# Patient Record
Sex: Female | Born: 1975 | Race: Black or African American | Hispanic: No | Marital: Single | Smoking: Never smoker
Health system: Southern US, Community
[De-identification: ages and names within clinical notes are randomized; demographics above are authoritative.]

## PROBLEM LIST (undated history)

## (undated) ENCOUNTER — Inpatient Hospital Stay: Payer: Self-pay

## (undated) DIAGNOSIS — F410 Panic disorder [episodic paroxysmal anxiety] without agoraphobia: Secondary | ICD-10-CM

## (undated) DIAGNOSIS — E0590 Thyrotoxicosis, unspecified without thyrotoxic crisis or storm: Secondary | ICD-10-CM

## (undated) DIAGNOSIS — I10 Essential (primary) hypertension: Secondary | ICD-10-CM

## (undated) DIAGNOSIS — F419 Anxiety disorder, unspecified: Secondary | ICD-10-CM

---

## 2013-12-20 ENCOUNTER — Emergency Department: Payer: Self-pay | Admitting: Emergency Medicine

## 2013-12-20 LAB — URINALYSIS, COMPLETE
Bacteria: NONE SEEN
Bilirubin,UR: NEGATIVE
GLUCOSE, UR: NEGATIVE mg/dL (ref 0–75)
Ketone: NEGATIVE
Leukocyte Esterase: NEGATIVE
Nitrite: NEGATIVE
Ph: 6 (ref 4.5–8.0)
Protein: NEGATIVE
RBC,UR: 4 /HPF (ref 0–5)
Specific Gravity: 1.019 (ref 1.003–1.030)
Squamous Epithelial: <1
WBC UR: 1 /HPF (ref 0–5)

## 2013-12-20 LAB — CBC WITH DIFFERENTIAL/PLATELET
BASOS ABS: 0 10*3/uL (ref 0.0–0.1)
BASOS PCT: 0.7 %
EOS PCT: 1.9 %
Eosinophil #: 0.1 10*3/uL (ref 0.0–0.7)
HCT: 38.5 % (ref 35.0–47.0)
HGB: 12.4 g/dL (ref 12.0–16.0)
LYMPHS PCT: 43.6 %
Lymphocyte #: 2.9 10*3/uL (ref 1.0–3.6)
MCH: 27.6 pg (ref 26.0–34.0)
MCHC: 32.2 g/dL (ref 32.0–36.0)
MCV: 86 fL (ref 80–100)
MONO ABS: 0.4 x10 3/mm (ref 0.2–0.9)
MONOS PCT: 6 %
NEUTROS PCT: 47.8 %
Neutrophil #: 3.2 10*3/uL (ref 1.4–6.5)
Platelet: 357 10*3/uL (ref 150–440)
RBC: 4.49 10*6/uL (ref 3.80–5.20)
RDW: 13.6 % (ref 11.5–14.5)
WBC: 6.7 10*3/uL (ref 3.6–11.0)

## 2013-12-20 LAB — BASIC METABOLIC PANEL
Anion Gap: 6 — ABNORMAL LOW (ref 7–16)
BUN: 10 mg/dL (ref 7–18)
Calcium, Total: 8.3 mg/dL — ABNORMAL LOW (ref 8.5–10.1)
Chloride: 107 mmol/L (ref 98–107)
Co2: 27 mmol/L (ref 21–32)
Creatinine: 0.67 mg/dL (ref 0.60–1.30)
EGFR (African American): >60
Glucose: 105 mg/dL — ABNORMAL HIGH (ref 65–99)
Osmolality: 279 (ref 275–301)
POTASSIUM: 3.2 mmol/L — AB (ref 3.5–5.1)
Sodium: 140 mmol/L (ref 136–145)

## 2013-12-20 LAB — TROPONIN I

## 2014-04-07 ENCOUNTER — Emergency Department
Admit: 2014-04-07 | Disposition: A | Discharge status: Home / Self Care | Payer: Self-pay | Admitting: Emergency Medicine

## 2014-04-28 ENCOUNTER — Ambulatory Visit
Admit: 2014-04-28 | Disposition: A | Discharge status: Home / Self Care | Payer: Self-pay | Attending: Family Medicine | Admitting: Family Medicine

## 2014-11-29 ENCOUNTER — Observation Stay
Admission: EM | Admit: 2014-11-29 | Discharge: 2014-11-29 | Disposition: A | Discharge status: Home / Self Care | Payer: 59 | Attending: Obstetrics and Gynecology | Admitting: Obstetrics and Gynecology

## 2014-11-29 DIAGNOSIS — R109 Unspecified abdominal pain: Secondary | ICD-10-CM

## 2014-11-29 DIAGNOSIS — Z3A35 35 weeks gestation of pregnancy: Secondary | ICD-10-CM | POA: Diagnosis not present

## 2014-11-29 DIAGNOSIS — Z041 Encounter for examination and observation following transport accident: Principal | ICD-10-CM | POA: Insufficient documentation

## 2014-11-29 DIAGNOSIS — O26899 Other specified pregnancy related conditions, unspecified trimester: Secondary | ICD-10-CM

## 2014-11-29 HISTORY — DX: Thyrotoxicosis, unspecified without thyrotoxic crisis or storm: E05.90

## 2014-11-29 HISTORY — DX: Essential (primary) hypertension: I10

## 2014-11-29 NOTE — Discharge Instructions (Signed)
Please drink plenty of water and rest. If you have any further questions or concerns please contact your provider.
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
## 2014-12-30 ENCOUNTER — Inpatient Hospital Stay
Admission: RE | Admit: 2014-12-30 | Discharge: 2014-12-30 | Disposition: A | Discharge status: Home / Self Care | Payer: 59 | Source: Ambulatory Visit

## 2014-12-30 NOTE — Lactation Note (Signed)
Lactation Consultation Note Mom needing lactation consult.  Mom hemorrhaged after deliver losing more than 700 ml blood loss and passed a lot of clots the next day.  [facility].  Mom's significant blood loss could have delayed her mature milk transitioning in.  Mom's nipples were bruised, scaly and really sore just before leaving the hospital.  Her nipples were so sore that she could not put the baby to right side and could not leave on left side very long.  Mom had been giving formula but Megan Novak had just been spitting most of it.  Mom is experienced breast feeder for 1 year with oldest and 7 months with second baby.  Megan Novak was 7 lbs 12 oz at birth and was discharged at 7 lbs 2 oz.  Mom had started putting her back to the breast, but she would either fall asleep and not continue sucking or would keep coming on and off the breast fussing.  Intake and out put has been adequate the last 24 hrs with 3 to 4  Yellow brownish stools and 6 voids.  Mom concerned that she is not getting enough while at the breast because she keeps falling asleep or refuses to latch at all and when she comes off the breast, she is still crying and they give her formula which she spits.  Assisted mom with latching Megan Novak to the breast today.  Mom has large nipples which Megan CellaJasmine has a difficulty achieving deep latch.  We worked on depth, but she keeps resorting to a shallow latch.  Mom's breasts are slightly full and engorgement was noted in tail of spence under both arm pits.  Explained that engorgement in the tail of Megan Novak is not abnormal and cold compresses and Ibuprofen could ease her discomfort.  Infrequent swallows are heard while at the breast and then only when we massaging the breast to increase milk flow. Pre and post feeding weight revealed that she only took 6 ml from right breast and 8 ml from left breast.  When removed from the breast after 25 minutes, she was extremely fussy and rooting  for more.  We put her back to the right breast and she took 6 ml more but still was unsatiated.  Mom pumped 20 ml which was given via bottle with slow flow nipple explaining to father of baby how to pace bottlefeed.  Mom plans to get DEBP through insurance.  Loaned mom Symphony pump for 48 hrs to build up milk supply and get baby back to breast.  Discussed methods of power pumping, super pumping, breast massage and foods and galactogogues to increase milk supply.  [facility]. She will [facility] LC [facility] Monday to return pump and re evaluate plan of care.    Patient Name: Megan PieriniFaith Yvette Novak EAVWU'JToday's Date: 12/30/2014 Reason for consult: Difficult latch;Breast/nipple pain   Maternal Data Formula Feeding for Exclusion: No Has patient been taught Hand Expression?: Yes Does the patient have breastfeeding experience prior to this delivery?: Yes  Feeding Feeding Type: Breast Fed Length of feed: 30 min  LATCH Score/Interventions Latch: Grasps breast easily, tongue down, lips flanged, rhythmical sucking.  Audible Swallowing: A few with stimulation Intervention(s): Hand expression;Alternate breast massage  Type of Nipple: Everted at rest and after stimulation (Large nipples)  Comfort (Breast/Nipple): Filling, red/small blisters or bruises, mild/mod discomfort  Problem noted: Filling;Mild/Moderate discomfort Interventions (Filling): Massage;Firm support;Frequent nursing;Double electric pump Interventions (Mild/moderate discomfort): Hand massage;Hand  expression;Post-pump  Hold (Positioning): Assistance needed to correctly position infant at breast and maintain latch. Intervention(s): Breastfeeding basics reviewed;Support Pillows;Position options;Skin to skin  LATCH Score: 7  Lactation Tools Discussed/Used Tools: Pump;Bottle Breast pump type: Double-Electric Breast Pump WIC Program: No Pump Review: Setup, frequency, and cleaning;Milk Storage Initiated  by:: S.Demara Lover,RN,BSN,IBCLC Date initiated:: 12/30/14   Consult Status Consult Status: Follow-up Date: 01/01/15 Follow-up type: Other (comment) (To return loaner Symphony pump or rent for another months)    Megan Novak 12/30/2014, 1:01 PM

## 2015-10-04 ENCOUNTER — Encounter (HOSPITAL_COMMUNITY): Payer: Self-pay

## 2015-10-17 ENCOUNTER — Encounter: Payer: Self-pay | Admitting: Emergency Medicine

## 2015-10-17 ENCOUNTER — Emergency Department
Admission: EM | Admit: 2015-10-17 | Discharge: 2015-10-17 | Disposition: A | Discharge status: Home / Self Care | Payer: Self-pay | Attending: Emergency Medicine | Admitting: Emergency Medicine

## 2015-10-17 DIAGNOSIS — H1011 Acute atopic conjunctivitis, right eye: Secondary | ICD-10-CM | POA: Insufficient documentation

## 2015-10-17 DIAGNOSIS — I10 Essential (primary) hypertension: Secondary | ICD-10-CM | POA: Insufficient documentation

## 2015-10-17 MED ORDER — EYE WASH OPHTH SOLN
1.0000 [drp] | OPHTHALMIC | Status: DC | PRN
  Filled 2015-10-17: qty 118

## 2015-10-17 MED ORDER — FLUORESCEIN SODIUM 1 MG OP STRP
1.0000 | ORAL_STRIP | Freq: Once | OPHTHALMIC | Status: DC
  Filled 2015-10-17: qty 1

## 2015-10-17 MED ORDER — OLOPATADINE HCL 0.2 % OP SOLN: 1.0000 [drp] | Freq: Once | OPHTHALMIC | 0 refills | Status: AC

## 2015-10-17 NOTE — ED Provider Notes (Signed)
[facility] Emergency Department Provider Note  ____________________________________________   First MD Initiated Contact with Patient 10/17/15 0809     (approximate)  I have reviewed the triage vital signs and the nursing notes.   HISTORY  Chief Complaint Eye Pain   HPI Megan Novak is a [age] y.o. female is here with complaint of right eye discomfort. Patient states that she was outside when she noticed that her eye began to feel different. She denies any foreign body to her eye. She denies any scratching sensation. Patient states that this came on abruptly and that area itches a great deal. Prior to her arrival in the emergency room she noticed that her upper and lower eyelid was swollen. She denies any difficulty seeing other than because her eyelid is swollen.In the past patient has taken an occasional allergy pill but has never experienced anything like today. Currently she rates her pain as a 4 out of 10. Also patient is aware that she is hypertensive and has not taken her blood pressure medication in several days.   Past Medical History:  Diagnosis Date  . Hypertension   . Hyperthyroidism     Patient Active Problem List   Diagnosis Date Noted  . Abdominal pain in pregnancy, antepartum 11/29/2014    History reviewed. No pertinent surgical history.  Prior to Admission medications   Medication Sig Start Date End Date Taking? Authorizing Provider  ferrous fumarate (HEMOCYTE - 106 MG FE) 325 (106 FE) MG TABS tablet Take 2 tablets by mouth daily.    Historical Provider, MD  Olopatadine HCl 0.2 % SOLN Apply 1 drop to eye once. To affected eye 10/17/15 10/17/15  Tommi Rumpshonda L Silvina Hackleman, PA-C  Prenatal Vit-Fe Fumarate-FA (PRENATAL MULTIVITAMIN) TABS tablet Take 1 tablet by mouth daily at 12 noon.    Historical Provider, MD    Allergies Review of patient's allergies indicates no known allergies.  No family history on file.  Social History Social  History  Substance Use Topics  . Smoking status: Never Smoker  . Smokeless tobacco: Never Used  . Alcohol use No    Review of Systems Constitutional: No fever/chills Eyes: Eyelid swelling positive. ENT: No sore throat. Cardiovascular: Denies chest pain. Respiratory: Denies shortness of breath. Gastrointestinal:   No nausea, no vomiting.  Skin: Negative for rash. Neurological: Negative for headaches, focal weakness or numbness.  10-point ROS otherwise negative.  ____________________________________________   PHYSICAL EXAM:  VITAL SIGNS: ED Triage Vitals [10/17/15 0809]  Enc Vitals Group     BP (!) 163/102     Pulse Rate 75     Resp 15     Temp 98.5 F (36.9 C)     Temp Source Oral     SpO2 98 %     Weight      Height      Head Circumference      Peak Flow      Pain Score 4     Pain Loc      Pain Edu?      Excl. in GC?     Constitutional: Alert and oriented. Well appearing and in no acute distress. Eyes: Right conjunctiva is edematous, watery, with minimal injection. There is clear drainage from the eye. Both upper and lower eyelids are edematous but no discoloration seen. Right upper eyelid inverted without foreign body noted. Fluorescein dye did not reveal corneal abrasion. PERRL. EOMI. Head: Atraumatic. Nose: No congestion/rhinnorhea. Mouth/Throat: Mucous membranes are moist.  Oropharynx non-erythematous. Neck:  No stridor.   Hematological/Lymphatic/Immunilogical: No cervical lymphadenopathy. Cardiovascular: Normal rate, regular rhythm. Grossly normal heart sounds.  Good peripheral circulation. Respiratory: Normal respiratory effort.  No retractions. Lungs CTAB. Musculoskeletal: Moves upper and lower extremities without any difficulty. Normal gait was noted. Neurologic:  Normal speech and language. No gross focal neurologic deficits are appreciated. No gait instability. Skin:  Skin is warm, dry and intact. No rash noted. Psychiatric: Mood and affect are normal.  Speech and behavior are normal.  ____________________________________________   LABS (all labs ordered are listed, but only abnormal results are displayed)  Labs Reviewed - No data to display  PROCEDURES  Procedure(s) performed: None  Procedures  Critical Care performed: No  ____________________________________________   INITIAL IMPRESSION / ASSESSMENT AND PLAN / ED COURSE  Pertinent labs & imaging results that were available during my care of the patient were reviewed by me and considered in my medical decision making (see chart for details).    Clinical Course  Patient is to begin taking over-the-counter Zyrtec or Claritin for allergies. Patient was given a prescription for Pataday ophthalmic solution for allergies. Patient is to follow-up with [facility] if any continued problems. We also discussed her blood pressure and lack control. Patient was given list of clinics that charge per sliding scale so that she can have her blood pressure managed. Patient has actually been out of blood pressure medicine for more than just several days.   ____________________________________________   FINAL CLINICAL IMPRESSION(S) / ED DIAGNOSES  Final diagnoses:  Allergic conjunctivitis of right eye  Hypertension, poor control      NEW MEDICATIONS STARTED DURING THIS VISIT:  Discharge Medication List as of 10/17/2015  8:56 AM    START taking these medications   Details  Olopatadine HCl 0.2 % SOLN Apply 1 drop to eye once. To affected eye, Starting Wed 10/17/2015, Print         Note:  This document was prepared using Dragon voice recognition software and may include unintentional dictation errors.    Tommi Rumps, PA-C 10/17/15 1249    Governor Rooks, MD 10/17/15 1257

## 2015-10-17 NOTE — Discharge Instructions (Signed)
Begin using cool compresses to the right eye frequently. Use Pataday eyedrops once a day to your right eye. Begin taking Claritin or Zyrtec over-the-counter for allergy symptoms. Follow-up with [facility] listed on your papers if no improvement in 2-3 days. Follow-up with the multiple clinics listed on your discharge papers for a primary care provider. Have your blood pressure rechecked as it was elevated in the emergency room today. Return to the emergency rooms if any severe worsening of your symptoms.

## 2015-10-17 NOTE — ED Triage Notes (Addendum)
States she noticed some eye irritation and swelling to right eye this am

## 2015-11-02 ENCOUNTER — Encounter: Payer: Self-pay | Admitting: Emergency Medicine

## 2015-11-02 ENCOUNTER — Emergency Department
Admission: EM | Admit: 2015-11-02 | Discharge: 2015-11-03 | Disposition: A | Discharge status: Home / Self Care | Payer: Medicaid Other | Attending: Emergency Medicine | Admitting: Emergency Medicine

## 2015-11-02 ENCOUNTER — Emergency Department: Payer: Medicaid Other

## 2015-11-02 DIAGNOSIS — I10 Essential (primary) hypertension: Secondary | ICD-10-CM | POA: Insufficient documentation

## 2015-11-02 DIAGNOSIS — E039 Hypothyroidism, unspecified: Secondary | ICD-10-CM | POA: Diagnosis not present

## 2015-11-02 DIAGNOSIS — Z79899 Other long term (current) drug therapy: Secondary | ICD-10-CM | POA: Insufficient documentation

## 2015-11-02 DIAGNOSIS — R519 Headache, unspecified: Secondary | ICD-10-CM

## 2015-11-02 DIAGNOSIS — R51 Headache: Secondary | ICD-10-CM | POA: Diagnosis not present

## 2015-11-02 LAB — BASIC METABOLIC PANEL
Anion gap: 7 (ref 5–15)
BUN: 11 mg/dL (ref 6–20)
CALCIUM: 8.9 mg/dL (ref 8.9–10.3)
CHLORIDE: 105 mmol/L (ref 101–111)
CO2: 25 mmol/L (ref 22–32)
Creatinine, Ser: 0.56 mg/dL (ref 0.44–1.00)
GFR calc Af Amer: >60 mL/min (ref >60)
GFR calc non Af Amer: >60 mL/min (ref >60)
GLUCOSE: 108 mg/dL — AB (ref 65–99)
Potassium: 3.3 mmol/L — ABNORMAL LOW (ref 3.5–5.1)
Sodium: 137 mmol/L (ref 135–145)

## 2015-11-02 LAB — POC URINE PREG, ED: Preg Test, Ur: NEGATIVE

## 2015-11-02 LAB — CBC WITH DIFFERENTIAL/PLATELET
Basophils Absolute: 0.1 10*3/uL (ref 0–0.1)
Basophils Relative: 1 %
Eosinophils Absolute: 0.1 10*3/uL (ref 0–0.7)
Eosinophils Relative: 1 %
HCT: 39.8 % (ref 35.0–47.0)
Hemoglobin: 13.2 g/dL (ref 12.0–16.0)
LYMPHS ABS: 2 10*3/uL (ref 1.0–3.6)
Lymphocytes Relative: 21 %
MCH: 28.4 pg (ref 26.0–34.0)
MCHC: 33.1 g/dL (ref 32.0–36.0)
MCV: 85.9 fL (ref 80.0–100.0)
MONO ABS: 0.5 10*3/uL (ref 0.2–0.9)
MONOS PCT: 5 %
NEUTROS ABS: 6.7 10*3/uL — AB (ref 1.4–6.5)
Neutrophils Relative %: 72 %
Platelets: 304 10*3/uL (ref 150–440)
RBC: 4.64 MIL/uL (ref 3.80–5.20)
RDW: 13.3 % (ref 11.5–14.5)
WBC: 9.3 10*3/uL (ref 3.6–11.0)

## 2015-11-02 LAB — TROPONIN I: Troponin I: <0.03 ng/mL (ref <0.03)

## 2015-11-02 MED ORDER — SODIUM CHLORIDE 0.9 % IV BOLUS (SEPSIS)
1000.0000 mL | Freq: Once | INTRAVENOUS | Status: AC
  Administered 2015-11-02: 1000 mL via INTRAVENOUS

## 2015-11-02 MED ORDER — AMLODIPINE BESYLATE 5 MG PO TABS
5.0000 mg | ORAL_TABLET | Freq: Once | ORAL | Status: AC
  Administered 2015-11-02: 5 mg via ORAL
  Filled 2015-11-02: qty 1

## 2015-11-02 MED ORDER — PROCHLORPERAZINE EDISYLATE 5 MG/ML IJ SOLN
10.0000 mg | Freq: Once | INTRAMUSCULAR | Status: AC
  Administered 2015-11-02: 10 mg via INTRAVENOUS
  Filled 2015-11-02: qty 2

## 2015-11-02 NOTE — Discharge Instructions (Addendum)
Please seek medical attention for any high fevers, chest pain, shortness of breath, change in behavior, persistent vomiting, bloody stool or any other new or concerning symptoms.  

## 2015-11-02 NOTE — ED Provider Notes (Signed)
[facility] Emergency Department Provider Note  ____________________________________________   First MD Initiated Contact with Patient 11/02/15 2100     (approximate)  I have reviewed the triage vital signs and the nursing notes.   HISTORY  Chief Complaint Headache and Hypertension   HPI Megan Novak is a 40 y.o. 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 She says that she thinks her blood pressure "creeping up." She says that she has been having headaches have been dull and mostly concentrated to the a.m. hours when she is waking up. However, tonight at 5 PM she said that she is sudden onset and a 10 headache the left side of her head that lasted up to an hour. She says the pain has eased off at this time. She had nausea but no vomiting. Says that she has had other intermittent sharp episodes of pain that just lasts for a second or 2. [place] Spaniel that she is a family history of high blood pressure. Light also bothers her.Does not report any neck stiffness.     Past Medical History:  Diagnosis Date  . Hypertension   . Hyperthyroidism     Patient Active Problem List   Diagnosis Date Noted  . Abdominal pain in pregnancy, antepartum 11/29/2014    History reviewed. No pertinent surgical history.  Prior to Admission medications   Medication Sig Start Date End Date Taking? Authorizing Provider  amLODipine (NORVASC) 5 MG tablet Take 5 mg by mouth daily.   Yes Historical Provider, MD  lisinopril (PRINIVIL,ZESTRIL) 40 MG tablet Take 40 mg by mouth daily.   Yes Historical Provider, MD  ferrous fumarate (HEMOCYTE - 106 MG FE) 325 (106 FE) MG TABS tablet Take 2 tablets by mouth daily.    Historical Provider, MD  Prenatal Vit-Fe Fumarate-FA (PRENATAL MULTIVITAMIN) TABS tablet Take 1 tablet by mouth daily at  12 noon.    Historical Provider, MD    Allergies Review of patient's allergies indicates no known allergies.  History reviewed. No pertinent family history.  Social History Social History  Substance Use Topics  . Smoking status: Never Smoker  . Smokeless tobacco: Never Used  . Alcohol use No    Review of Systems Constitutional: No fever/chills Eyes: No visual changes. ENT: No sore throat. Cardiovascular: Denies chest pain. Respiratory: Denies shortness of breath. Gastrointestinal: No abdominal pain.  no vomiting.  No diarrhea.  No constipation. Genitourinary: Negative for dysuria. Musculoskeletal: Negative for back pain. Skin: Negative for rash. Neurological: Negative for focal weakness or numbness.  10-point ROS otherwise negative.  ____________________________________________   PHYSICAL EXAM:  VITAL SIGNS: ED Triage Vitals [11/02/15 2027]  Enc Vitals Group     BP (!) 180/95     Pulse Rate 66     Resp 17     Temp 98.4 F (36.9 C)     Temp Source Oral     SpO2 95 %     Weight 143 lb (64.9 kg)     Height 5\' 3"  (1.6 m)     Head Circumference      Peak Flow      Pain Score 10     Pain Loc      Pain Edu?      Excl. in GC?     Constitutional: Alert and oriented. Well appearing and in no acute distress.  Eyes: Conjunctivae are normal. PERRL. EOMI. Head: Atraumatic. Nose: No congestion/rhinnorhea. Mouth/Throat: Mucous membranes are moist.   Neck: No stridor.  Ranges freely without any signs of obstruction or pain. Cardiovascular: Normal rate, regular rhythm.    Respiratory: Normal respiratory effort.  No retractions. Lungs CTAB. Gastrointestinal: Soft and nontender. No distention.  Musculoskeletal: No lower extremity tenderness nor edema.  No joint effusions. Neurologic:  Normal speech and language. No gross focal neurologic deficits are appreciated.  Skin:  Skin is warm, dry and intact. No rash noted. Psychiatric: Mood and affect are normal. Speech and  behavior are normal.  ____________________________________________   LABS (all labs ordered are listed, but only abnormal results are displayed)  Labs Reviewed  CBC WITH DIFFERENTIAL/PLATELET  BASIC METABOLIC PANEL  TROPONIN I  POC URINE PREG, ED   ____________________________________________  EKG  ED ECG REPORT I, Schaevitz,  Teena Iraniavid M, the attending physician, personally viewed and interpreted this ECG.   Date: 11/02/2015  EKG Time: 2146  Rate: 64  Rhythm: normal sinus rhythm  Axis: Normal axis  Intervals:none  ST&T Change: No ST segment elevation or depression. T-wave inversion versus biphasic T waves in V2 and V3.  ____________________________________________  RADIOLOGY  Pending CT of the brain ____________________________________________   PROCEDURES  Procedure(s) performed:   Procedures  Critical Care performed:   ____________________________________________   INITIAL IMPRESSION / ASSESSMENT AND PLAN / ED COURSE  Pertinent labs & imaging results that were available during my care of the patient were reviewed by me and considered in my medical decision making (see chart for details).  ----------------------------------------- 11:14 PM on 11/02/2015 -----------------------------------------  Discussed increasing the patient's amlodipine to 10 mg daily and staying on her same dose of lisinopril. After amlodipine and appears that her blood pressure has decreased. Pending CT of the brain. CT done within 6 hours of acute onset of a headache.  If negative and pain is relieved unlikely to be subarachnoid hemorrhage. Signed out to Dr. Derrill KayGoodman.  Clinical Course     ____________________________________________   FINAL CLINICAL IMPRESSION(S) / ED DIAGNOSES  Headache. Hypertension.    NEW MEDICATIONS STARTED DURING THIS VISIT:  New Prescriptions   No medications on file     Note:  This document was prepared using Dragon voice recognition software  and may include unintentional dictation errors.    Myrna Blazeravid Matthew Schaevitz, MD 11/02/15 [id number]

## 2015-11-02 NOTE — ED Triage Notes (Signed)
Pt presents to ED with c/o severe headache intromittently the past several days that was worse today upon waking. Pt states her blood pressure at home was 174/97.  Hx of post partum preeclampsia.

## 2015-11-03 NOTE — ED Provider Notes (Signed)
Head CT without concerning findings. Patient did state she felt better and comfortable going home after headache medications.   Phineas SemenGraydon Kalsey Lull, MD 11/03/15 [phone]

## 2017-01-13 ENCOUNTER — Ambulatory Visit
Admission: EM | Admit: 2017-01-13 | Discharge: 2017-01-13 | Disposition: A | Discharge status: Home / Self Care | Payer: Worker's Compensation | Attending: Family Medicine | Admitting: Family Medicine

## 2017-01-13 ENCOUNTER — Encounter: Payer: Self-pay | Admitting: *Deleted

## 2017-01-13 DIAGNOSIS — M7918 Myalgia, other site: Secondary | ICD-10-CM | POA: Diagnosis not present

## 2017-01-13 DIAGNOSIS — M545 Low back pain: Secondary | ICD-10-CM

## 2017-01-13 DIAGNOSIS — M25561 Pain in right knee: Secondary | ICD-10-CM

## 2017-01-13 DIAGNOSIS — M25551 Pain in right hip: Secondary | ICD-10-CM | POA: Diagnosis not present

## 2017-01-13 DIAGNOSIS — W010XXA Fall on same level from slipping, tripping and stumbling without subsequent striking against object, initial encounter: Secondary | ICD-10-CM

## 2017-01-13 MED ORDER — CYCLOBENZAPRINE HCL 10 MG PO TABS: 10.0000 mg | ORAL_TABLET | Freq: Three times a day (TID) | ORAL | 0 refills | Status: DC | PRN

## 2017-01-13 MED ORDER — MELOXICAM 15 MG PO TABS: 15.0000 mg | ORAL_TABLET | Freq: Every day | ORAL | 0 refills | Status: DC

## 2017-01-13 NOTE — Discharge Instructions (Signed)
Meds as prescribed. ° °Take care ° °Dr. Bogucki  °

## 2017-01-13 NOTE — ED Provider Notes (Signed)
[facility]   CSN: [id number] Arrival date & time: 01/13/17  1840   History   Chief Complaint Chief Complaint  Patient presents with  . Back Pain  . Hip Pain  . Knee Pain   HPI  42 year old female presents with the above complaints.  Patient states that she was at work last night and tripped over a cord.  She fell and injured her right knee, right hip, and low back.  Pain is moderate in severity, 7/10.  No reports of swelling.  She reports she is used ibuprofen and Tylenol without resolution.  Worse with range of motion.  No known relieving factors.  No other associated symptoms.  No other complaints at this time.  Past Medical History:  Diagnosis Date  . Hypertension   . Hyperthyroidism    Patient Active Problem List   Diagnosis Date Noted  . Abdominal pain in pregnancy, antepartum 11/29/2014   History reviewed. No pertinent surgical history.  OB History    Gravida Para Term Preterm AB Living   3 2 2  0 0 2   SAB TAB Ectopic Multiple Live Births   0 0 0 0       Home Medications    Prior to Admission medications   Medication Sig Start Date End Date Taking? Authorizing Provider  amLODipine (NORVASC) 5 MG tablet Take 5 mg by mouth daily.   Yes [provider]  ferrous fumarate (HEMOCYTE - 106 MG FE) 325 (106 FE) MG TABS tablet Take 2 tablets by mouth daily.   Yes [provider]  lisinopril (PRINIVIL,ZESTRIL) 40 MG tablet Take 40 mg by mouth daily.   Yes [provider]  cyclobenzaprine (FLEXERIL) 10 MG tablet Take 1 tablet (10 mg total) by mouth 3 (three) times daily as needed for muscle spasms. 01/13/17   Tommie Samsook, Jenissa Tyrell G, DO  meloxicam (MOBIC) 15 MG tablet Take 1 tablet (15 mg total) by mouth daily. 01/13/17   Tommie Samsook, Jinny Sweetland G, DO  Prenatal Vit-Fe Fumarate-FA (PRENATAL MULTIVITAMIN) TABS tablet Take 1 tablet by mouth daily at 12 noon.    [provider]   Family History Family History  Problem Relation Age of Onset  . Diabetes  Mother   . Hypertension Mother   . Thyroid disease Mother   . CAD Mother   . Cancer Father    Social History Social History   Tobacco Use  . Smoking status: Never Smoker  . Smokeless tobacco: Never Used  Substance Use Topics  . Alcohol use: No  . Drug use: No   Allergies   Patient has no known allergies.  Review of Systems Review of Systems  Constitutional: Negative.   Musculoskeletal: Positive for back pain.       Right knee pain, right hip pain.   Physical Exam Triage Vital Signs ED Triage Vitals  Enc Vitals Group     BP 01/13/17 1856 (!) 147/83     Pulse Rate 01/13/17 1856 72     Resp 01/13/17 1856 16     Temp 01/13/17 1856 98.6 F (37 C)     Temp Source 01/13/17 1856 Oral     SpO2 01/13/17 1856 100 %     Weight 01/13/17 1858 151 lb (68.5 kg)     Height 01/13/17 1858 5\' 3"  (1.6 m)     Head Circumference --      Peak Flow --      Pain Score 01/13/17 1859 7     Pain Loc --  Pain Edu? --      Excl. in GC? --    Updated Vital Signs BP (!) 147/83 (BP Location: Left Arm)   Pulse 72   Temp 98.6 F (37 C) (Oral)   Resp 16   Ht 5\' 3"  (1.6 m)   Wt 151 lb (68.5 kg)   LMP 01/04/2017   SpO2 100%   BMI 26.75 kg/m    Physical Exam  Constitutional: She is oriented to person, place, and time. She appears well-developed and well-nourished. No distress.  Cardiovascular: Normal rate and regular rhythm.  No murmur heard. Pulmonary/Chest: Effort normal and breath sounds normal. She has no wheezes. She has no rales.  Musculoskeletal:  Right knee -joint line tenderness medially and laterally. Ligaments intact.  No effusion.  Lumbar -right paraspinal muscular tenderness.  Decreased range of motion secondary to pain.  Negative straight leg raise.    Neurological: She is alert and oriented to person, place, and time.  Psychiatric: She has a normal mood and affect. Her behavior is normal.  Nursing note and vitals reviewed.  UC Treatments / Results  Labs (all labs  ordered are listed, but only abnormal results are displayed) Labs Reviewed - No data to display  EKG  EKG Interpretation None       Radiology No results found.  Procedures Procedures (including critical care time)  Medications Ordered in UC Medications - No data to display   Initial Impression / Assessment and Plan / UC Course  I have reviewed the triage vital signs and the nursing notes.  Pertinent labs & imaging results that were available during my care of the patient were reviewed by me and considered in my medical decision making (see chart for details).     42 year old female presents with musculoskeletal pain after a fall.  Treating with Flexeril and meloxicam.  Final Clinical Impressions(s) / UC Diagnoses   Final diagnoses:  Musculoskeletal pain    ED Discharge Orders        Ordered    meloxicam (MOBIC) 15 MG tablet  Daily     01/13/17 2006    cyclobenzaprine (FLEXERIL) 10 MG tablet  3 times daily PRN     01/13/17 2006     Controlled Substance Prescriptions [place] Controlled Substance Registry consulted? Not Applicable   Dhani, Imel, DO 01/13/17 2122

## 2017-01-13 NOTE — ED Triage Notes (Signed)
While at work last night, pt tripped on a "cord" and fell landing on her right knee and back. Now c/o rightsided low back pain with pain to the right hip and knee.

## 2017-01-16 ENCOUNTER — Telehealth: Payer: Self-pay | Admitting: Emergency Medicine

## 2017-01-16 NOTE — Telephone Encounter (Signed)
Left message for patient to call us back regarding any questions or concerns regarding her recent visit [facility].

## 2017-10-19 ENCOUNTER — Encounter: Payer: Self-pay | Admitting: Emergency Medicine

## 2017-10-19 ENCOUNTER — Ambulatory Visit
Admission: EM | Admit: 2017-10-19 | Discharge: 2017-10-19 | Disposition: A | Discharge status: Home / Self Care | Payer: Medicaid Other | Attending: Family Medicine | Admitting: Family Medicine

## 2017-10-19 ENCOUNTER — Other Ambulatory Visit: Payer: Self-pay

## 2017-10-19 DIAGNOSIS — J029 Acute pharyngitis, unspecified: Secondary | ICD-10-CM | POA: Diagnosis not present

## 2017-10-19 DIAGNOSIS — R509 Fever, unspecified: Secondary | ICD-10-CM

## 2017-10-19 DIAGNOSIS — R05 Cough: Secondary | ICD-10-CM

## 2017-10-19 LAB — RAPID STREP SCREEN (MED CTR MEBANE ONLY): Streptococcus, Group A Screen (Direct): NEGATIVE

## 2017-10-19 NOTE — ED Triage Notes (Signed)
Patient c/o sore throat that started this morning.

## 2017-10-19 NOTE — ED Provider Notes (Signed)
[facility]    CSN: [id number] Arrival date & time: 10/19/17  4098     History   Chief Complaint Chief Complaint  Patient presents with  . Sore Throat    HPI Breely Sandy Haye is a 42 y.o. female.   HPI  42 year old female presents along with her daughter who has the same symptoms.  She started having a sore throat this morning.  She is had a low-grade fever but no significant chills.  Had a mild cough.  Currently temperature is 99.5 pulse rate of 80 blood pressure 129/80 O2 sats 99% on room air         Past Medical History:  Diagnosis Date  . Hypertension   . Hyperthyroidism     Patient Active Problem List   Diagnosis Date Noted  . Abdominal pain in pregnancy, antepartum 11/29/2014    History reviewed. No pertinent surgical history.  OB History    Gravida  3   Para  2   Term  2   Preterm  0   AB  0   Living  2     SAB  0   TAB  0   Ectopic  0   Multiple  0   Live Births               Home Medications    Prior to Admission medications   Medication Sig Start Date End Date Taking? Authorizing Provider  amLODipine (NORVASC) 5 MG tablet Take 5 mg by mouth daily.   Yes [provider]  lisinopril (PRINIVIL,ZESTRIL) 40 MG tablet Take 40 mg by mouth daily.   Yes [provider]    Family History Family History  Problem Relation Age of Onset  . Diabetes Mother   . Hypertension Mother   . Thyroid disease Mother   . CAD Mother   . Cancer Father     Social History Social History   Tobacco Use  . Smoking status: Never Smoker  . Smokeless tobacco: Never Used  Substance Use Topics  . Alcohol use: No  . Drug use: No     Allergies   Metronidazole   Review of Systems Review of Systems  Constitutional: Positive for activity change, fatigue and fever. Negative for chills.  HENT: Positive for congestion and sore throat.   Respiratory: Positive for cough.   All other systems reviewed and are  negative.    Physical Exam Triage Vital Signs ED Triage Vitals  Enc Vitals Group     BP 10/19/17 1852 129/80     Pulse Rate 10/19/17 1852 80     Resp 10/19/17 1852 14     Temp 10/19/17 1852 99.5 F (37.5 C)     Temp Source 10/19/17 1852 Oral     SpO2 10/19/17 1852 99 %     Weight 10/19/17 1849 143 lb (64.9 kg)     Height 10/19/17 1849 5\' 2"  (1.575 m)     Head Circumference --      Peak Flow --      Pain Score 10/19/17 1849 2     Pain Loc --      Pain Edu? --      Excl. in GC? --    No data found.  Updated Vital Signs BP 129/80 (BP Location: Left Arm)   Pulse 80   Temp 99.5 F (37.5 C) (Oral)   Resp 14   Ht 5\' 2"  (1.575 m)   Wt 143 lb (64.9 kg)  LMP 10/05/2017   SpO2 99%   Breastfeeding? No   BMI 26.16 kg/m   Visual Acuity Right Eye Distance:   Left Eye Distance:   Bilateral Distance:    Right Eye Near:   Left Eye Near:    Bilateral Near:     Physical Exam  Constitutional: She is oriented to person, place, and time. She appears well-developed and well-nourished.  Non-toxic appearance. She does not appear ill. No distress.  HENT:  Head: Normocephalic.  Right Ear: Tympanic membrane and ear canal normal.  Left Ear: Tympanic membrane and ear canal normal.  Mouth/Throat: Oropharynx is clear and moist and mucous membranes are normal. No oral lesions. No uvula swelling. No oropharyngeal exudate, posterior oropharyngeal edema or posterior oropharyngeal erythema. Tonsils are 0 on the right. Tonsils are 0 on the left. No tonsillar exudate.  Eyes: Pupils are equal, round, and reactive to light.  Pulmonary/Chest: Effort normal and breath sounds normal.  Neurological: She is alert and oriented to person, place, and time.  Skin: Skin is warm and dry.  Psychiatric: She has a normal mood and affect. Her behavior is normal.  Nursing note and vitals reviewed.    UC Treatments / Results  Labs (all labs ordered are listed, but only abnormal results are displayed) Labs  Reviewed  RAPID STREP SCREEN ([facility] ONLY)  CULTURE, GROUP A STREP [facility])    EKG None  Radiology No results found.  Procedures Procedures (including critical care time)  Medications Ordered in UC Medications - No data to display  Initial Impression / Assessment and Plan / UC Course  I have reviewed the triage vital signs and the nursing notes.  Pertinent labs & imaging results that were available during my care of the patient were reviewed by me and considered in my medical decision making (see chart for details).      Discussed with Patient that her rapid strep was negative for group A.  We will culture the throat swab which will be available in 48 hours.  This is likely a viral illness does not require antibiotics at this time.  Meantime she will take the Profen or Tylenol for fever or body aches also for sore throat pain.  She can gargle with salt water ad lib. for discomfort.  If she is not improving she should follow-up with her primary care physician Final Clinical Impressions(s) / UC Diagnoses   Final diagnoses:  Sore throat   Discharge Instructions   None    ED Prescriptions    None     Controlled Substance Prescriptions [place] Controlled Substance Registry consulted? Not Applicable   Lutricia Feil, PA-C 10/19/17 1943

## 2017-10-22 LAB — CULTURE, GROUP A STREP (THRC)

## 2018-01-16 IMAGING — CT CT HEAD W/O CM
3 series · 15 of 45 positions shown, 18 images · non-contrast
Comparison: 12/20/2013

CLINICAL DATA: Intermittent severe headache for several days
worsening upon waking.

EXAM:
CT HEAD WITHOUT CONTRAST
TECHNIQUE: Contiguous axial images were obtained from the base of the skull
through the vertex without intravenous contrast.

[Series 2: head wo · axial · 0.40mm/px · z∈[-139,-24]mm · 9 of 28 slices shown, 12 images]
[im 3/28  brain]
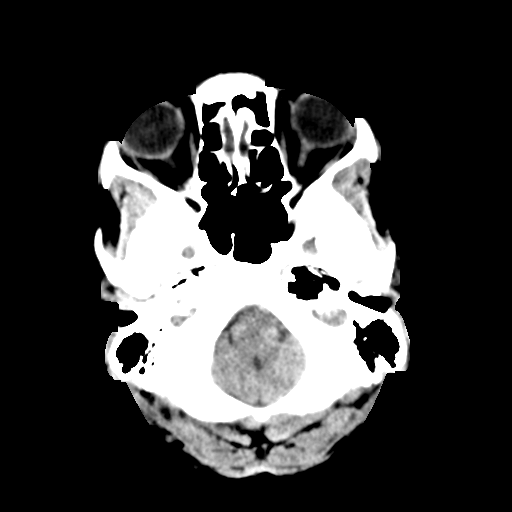
[im 3/28  bone]
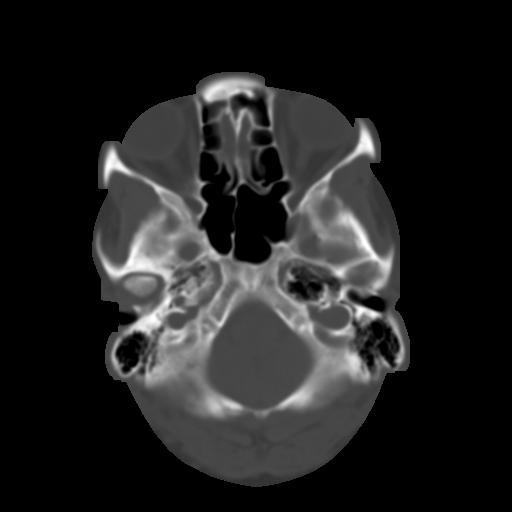
[im 6/28  brain]
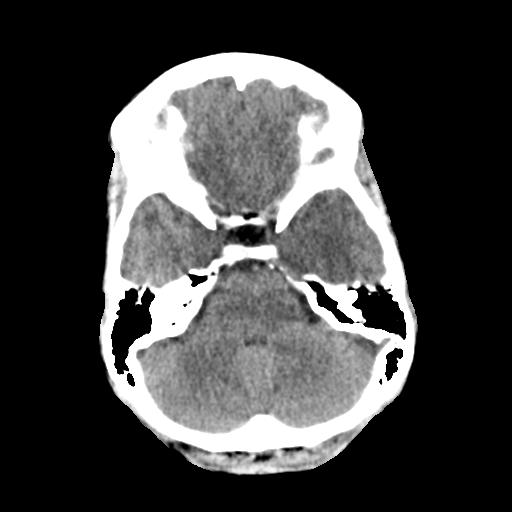
[im 9/28  brain]
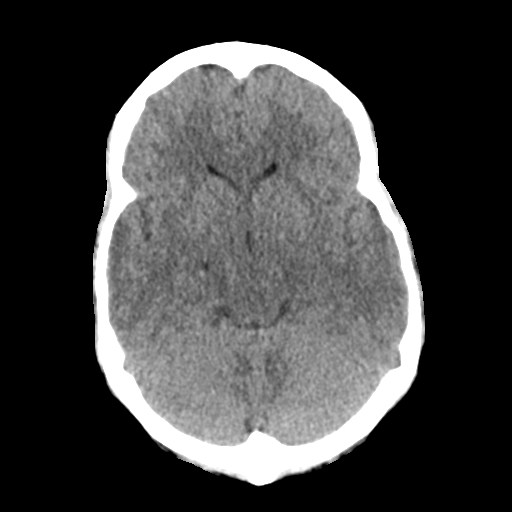
[im 12/28  brain]
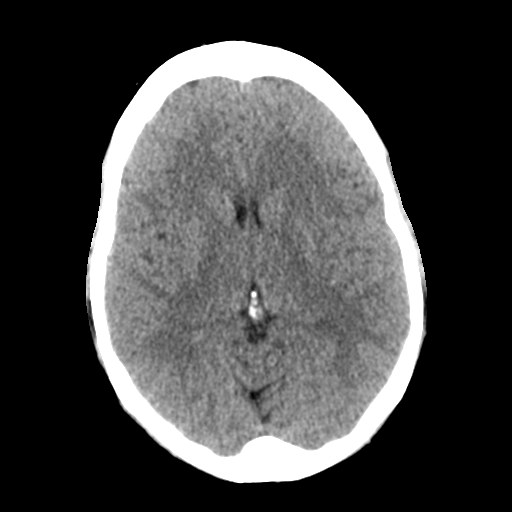
[im 15/28  brain]
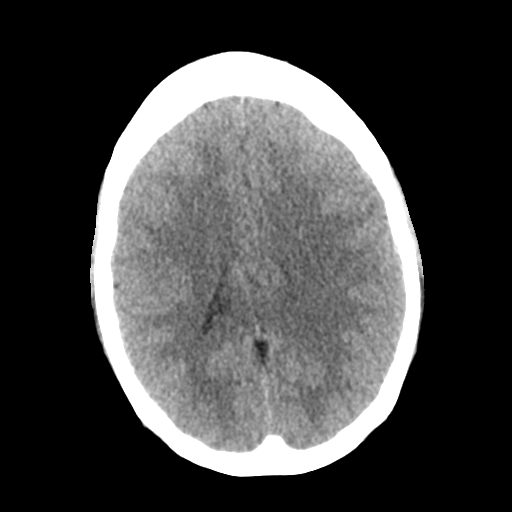
[im 15/28  bone]
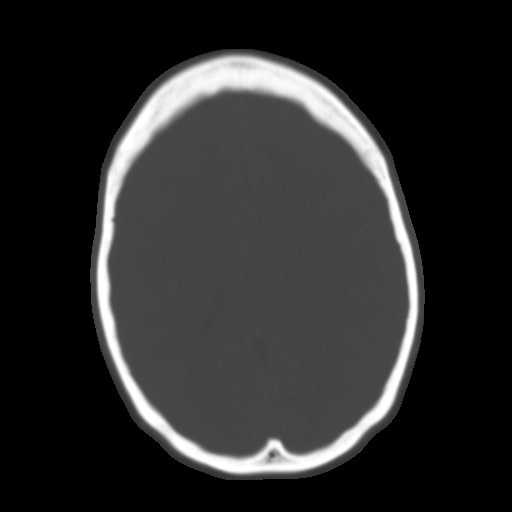
[im 17/28  brain]
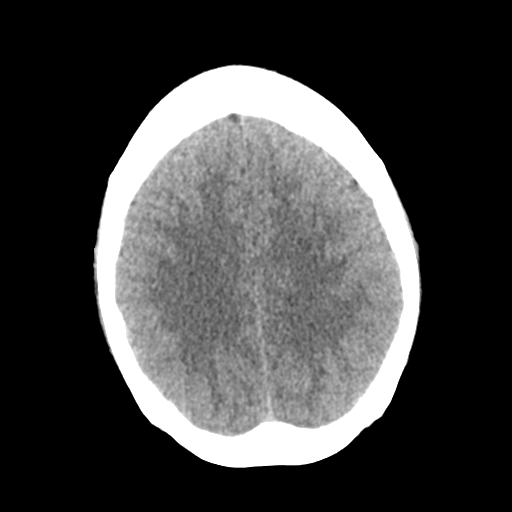
[im 20/28  brain]
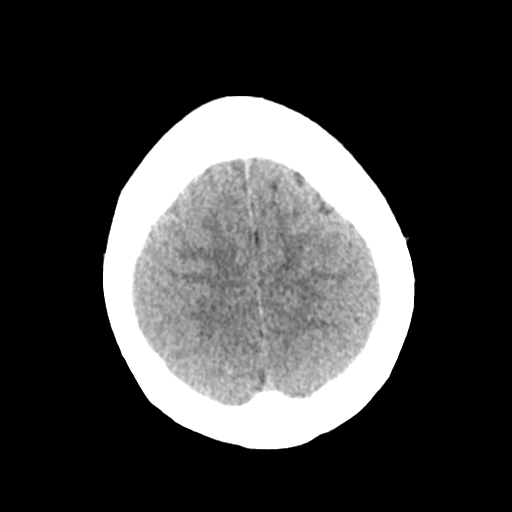
[im 23/28  brain]
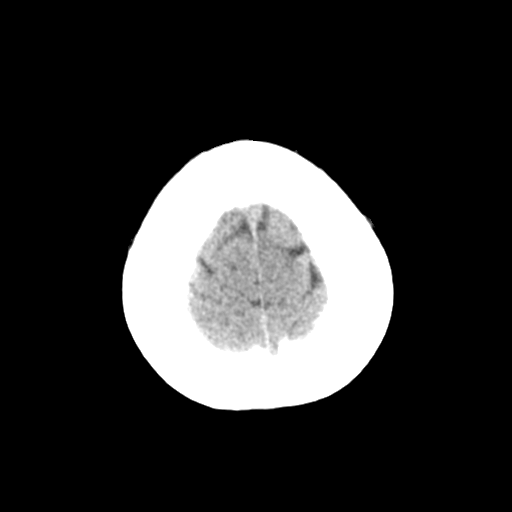
[im 26/28  brain]
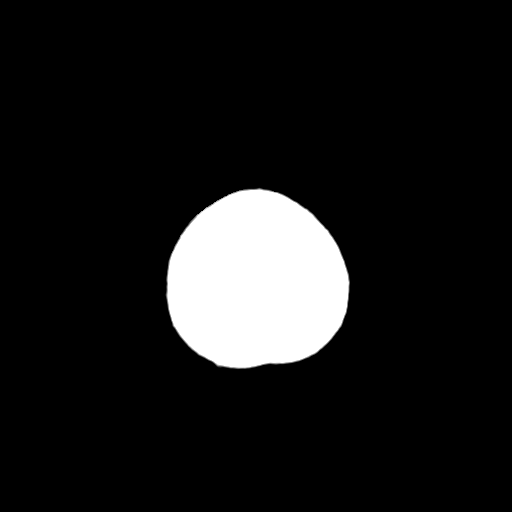
[im 26/28  bone]
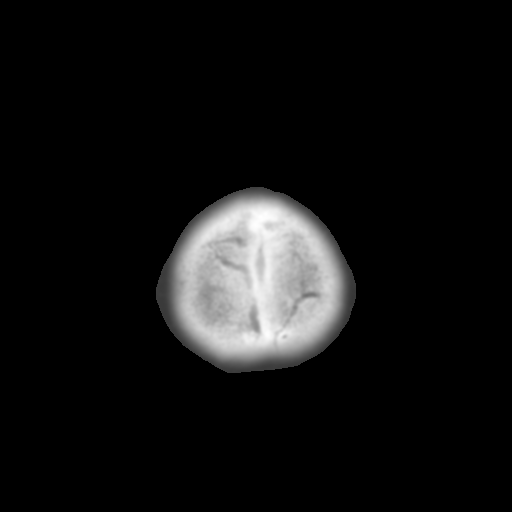

[Series 4: coronal soft tissue · coronal · 0.28mm/px · 3 of 58 slices shown]
[im 20/58  brain]
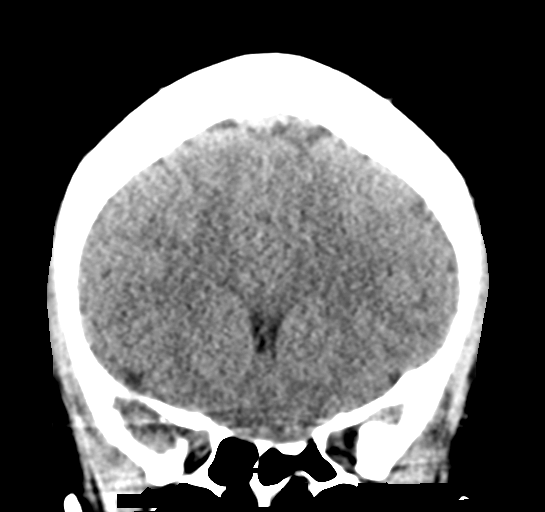
[im 26/58  brain]
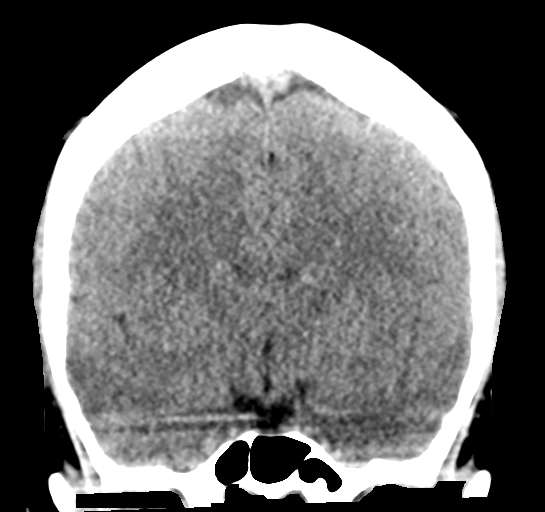
[im 32/58  brain]
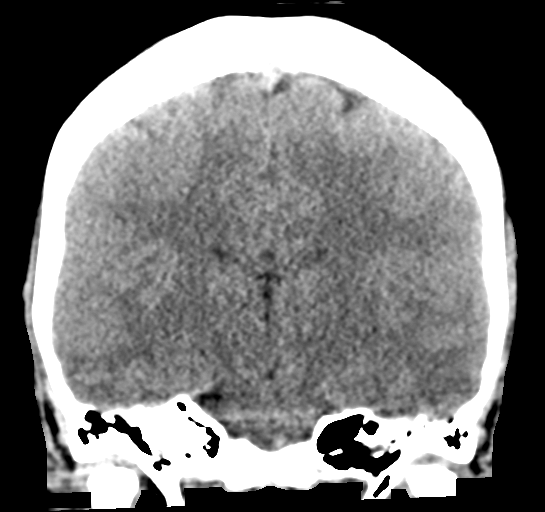

[Series 5: sagittal soft tissue · sagittal · 0.27mm/px · 3 of 48 slices shown]
[im 16/48  brain]
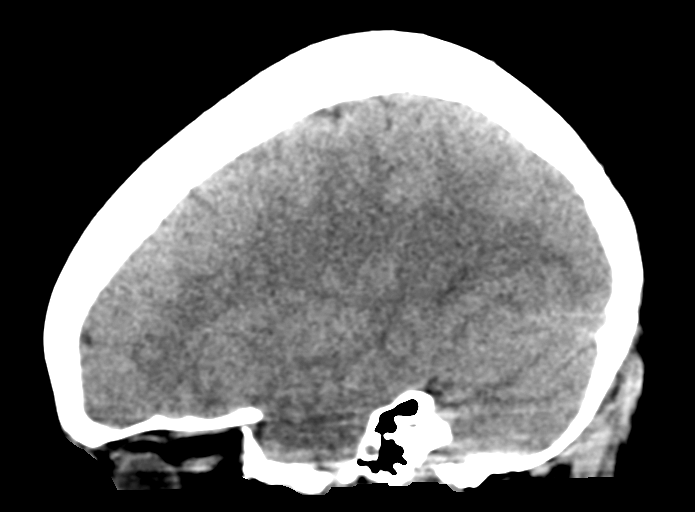
[im 24/48  brain]
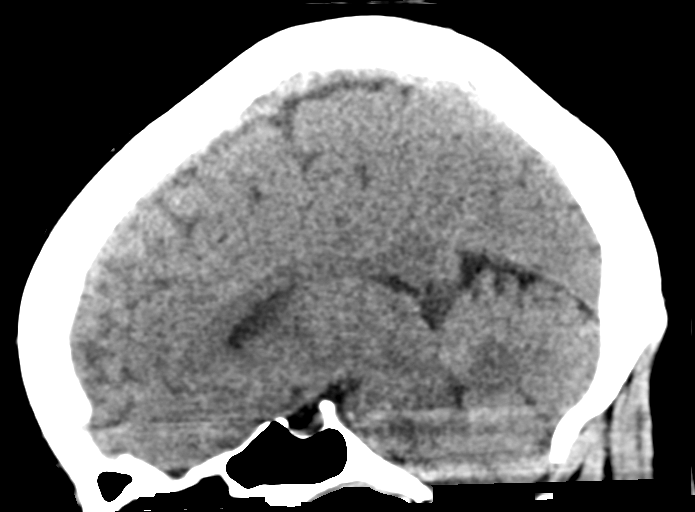
[im 32/48  brain]
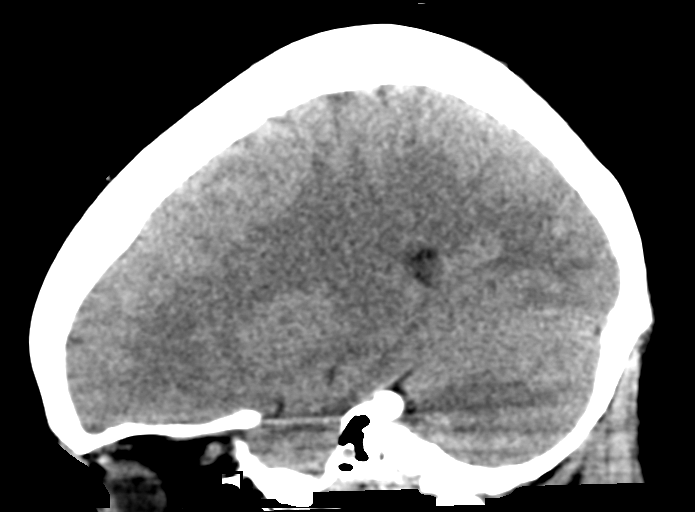

[15 of 45 positions shown; findings below may reference images not displayed]

FINDINGS: BRAIN: The ventricles and sulci are normal. No intraparenchymal
hemorrhage, mass effect nor midline shift. No acute large vascular
territory infarcts. No abnormal extra-axial fluid collections. Basal
cisterns are patent.

VASCULAR: Unremarkable.

SKULL/SOFT TISSUES: No skull fracture. No significant soft tissue
swelling.

ORBITS/SINUSES: The included ocular globes and orbital contents are
normal.The mastoid aircells and included paranasal sinuses are
well-aerated.

OTHER: None.
IMPRESSION: No acute intracranial process.  Normal head CT

## 2018-02-05 ENCOUNTER — Ambulatory Visit
Admission: EM | Admit: 2018-02-05 | Discharge: 2018-02-05 | Disposition: A | Discharge status: Home / Self Care | Payer: Medicaid Other | Attending: Family Medicine | Admitting: Family Medicine

## 2018-02-05 ENCOUNTER — Other Ambulatory Visit: Payer: Self-pay

## 2018-02-05 DIAGNOSIS — I10 Essential (primary) hypertension: Secondary | ICD-10-CM

## 2018-02-05 DIAGNOSIS — R079 Chest pain, unspecified: Secondary | ICD-10-CM | POA: Insufficient documentation

## 2018-02-05 DIAGNOSIS — Z8249 Family history of ischemic heart disease and other diseases of the circulatory system: Secondary | ICD-10-CM

## 2018-02-05 DIAGNOSIS — F419 Anxiety disorder, unspecified: Secondary | ICD-10-CM

## 2018-02-05 HISTORY — DX: Panic disorder (episodic paroxysmal anxiety): F41.0

## 2018-02-05 HISTORY — DX: Anxiety disorder, unspecified: F41.9

## 2018-02-05 NOTE — Discharge Instructions (Signed)
This is likely musculoskeletal in origin.  However, given your risk factors you need a cardiac evaluation/labs in the ER.  Please go directly there.  Take care  Dr. Adriana Simasook

## 2018-02-05 NOTE — ED Triage Notes (Addendum)
Pt reports central chest tightness/pain starting approx 30 min PTA. Worse with deep breath or movement. Pain 6/10. Pt reports she has anxiety and has been under a great deal of stress. Lost her sister to MI several months ago

## 2018-02-05 NOTE — ED Provider Notes (Signed)
[facility]    CSN: [id number] Arrival date & time: 02/05/18  1107  History   Chief Complaint Chief Complaint  Patient presents with  . Chest Pain   HPI   43 year old female presents with chest pain.  Patient reports chest pain approximately 30 minutes prior to arrival.  She states that it has been going on for the past 1.5 hours now.  Abruptly today.  Located centrally and slightly to the left and right of the sternum.  She had nausea yesterday.  No vomiting.  Mild shortness of breath.  She reports that her pain is worse with movement and deep breathing.  Patient reports that she is quite anxious about this now.  Her sister recently passed away from an MI at 3345.  No known relieving factors.  No reports of diaphoresis.  No other associated symptoms.  No other complaints.  History reviewed as below.  Past Medical History:  Diagnosis Date  . Anxiety   . Hypertension   . Hyperthyroidism   . Panic attacks    OB History    Gravida  3   Para  2   Term  2   Preterm  0   AB  0   Living  2     SAB  0   TAB  0   Ectopic  0   Multiple  0   Live Births             Home Medications    Prior to Admission medications   Medication Sig Start Date End Date Taking? Authorizing Provider  busPIRone (BUSPAR) 5 MG tablet Take by mouth. 09/11/17 09/11/18 Yes [provider]  amLODipine (NORVASC) 5 MG tablet Take 5 mg by mouth daily.    [provider]  lisinopril (PRINIVIL,ZESTRIL) 40 MG tablet Take 40 mg by mouth daily.    [provider]   Family History Family History  Problem Relation Age of Onset  . Diabetes Mother   . Hypertension Mother   . Thyroid disease Mother   . CAD Mother   . Cancer Father   . Heart attack Sister    Social History Social History   Tobacco Use  . Smoking status: Never Smoker  . Smokeless tobacco: Never Used  Substance Use Topics  . Alcohol use: Yes    Comment: ocassional wine  . Drug use: No    Allergies   Metronidazole   Review of Systems Review of Systems  Cardiovascular: Positive for chest pain.  Psychiatric/Behavioral: The patient is nervous/anxious.    Physical Exam Triage Vital Signs ED Triage Vitals  Enc Vitals Group     BP 02/05/18 1121 (!) 153/101     Pulse Rate 02/05/18 1121 100     Resp 02/05/18 1121 16     Temp 02/05/18 1121 98.1 F (36.7 C)     Temp Source 02/05/18 1121 Oral     SpO2 02/05/18 1121 100 %     Weight 02/05/18 1123 130 lb (59 kg)     Height 02/05/18 1123 5\' 3"  (1.6 m)     Head Circumference --      Peak Flow --      Pain Score 02/05/18 1123 6     Pain Loc --      Pain Edu? --      Excl. in GC? --    Updated Vital Signs BP (!) 153/101 (BP Location: Right Arm)   Pulse 100   Temp 98.1 F (  36.7 C) (Oral)   Resp 16   Ht 5\' 3"  (1.6 m)   Wt 59 kg   LMP 02/02/2018   SpO2 100%   BMI 23.03 kg/m   Visual Acuity Right Eye Distance:   Left Eye Distance:   Bilateral Distance:    Right Eye Near:   Left Eye Near:    Bilateral Near:     Physical Exam Vitals signs and nursing note reviewed.  Constitutional:      General: She is not in acute distress.    Appearance: She is not ill-appearing.  HENT:     Head: Normocephalic and atraumatic.     Mouth/Throat:     Pharynx: Oropharynx is clear. No posterior oropharyngeal erythema.  Eyes:     General: No scleral icterus.    Conjunctiva/sclera: Conjunctivae normal.  Cardiovascular:     Rate and Rhythm: Regular rhythm. Tachycardia present.  Pulmonary:     Effort: Pulmonary effort is normal.     Breath sounds: Normal breath sounds. No wheezing, rhonchi or rales.     Comments: Patient has a discrete area of tenderness to the left of the sternum. Abdominal:     General: There is no distension.     Palpations: Abdomen is soft.     Tenderness: There is no abdominal tenderness.  Neurological:     Mental Status: She is alert.  Psychiatric:     Comments: Flat affect.  Appears depressed.     UC Treatments / Results  Labs (all labs ordered are listed, but only abnormal results are displayed) Labs Reviewed - No data to display  EKG Interpretation: Sinus tachycardia at rate of 107.  Normal axis.  Normal intervals.  No discrete ST or T wave changes.  Unremarkable EKG.  Radiology No results found.  Procedures Procedures (including critical care time)  Medications Ordered in UC Medications - No data to display  Initial Impression / Assessment and Plan / UC Course  I have reviewed the triage vital signs and the nursing notes.  Pertinent labs & imaging results that were available during my care of the patient were reviewed by me and considered in my medical decision making (see chart for details).    43 year old female presents with chest pain.  This is likely musculoskeletal in origin.  There is also an anxiety component as well.  However, patient has risk factors and has a early family history of cardiac disease.  I have advised that her husband take her to the ER for chest pain rule out.  They are in agreement.  Final Clinical Impressions(s) / UC Diagnoses   Final diagnoses:  Chest pain, unspecified type     Discharge Instructions     This is likely musculoskeletal in origin.  However, given your risk factors you need a cardiac evaluation/labs in the ER.  Please go directly there.  Take care  Dr. Adriana Simas   ED Prescriptions    None     Controlled Substance Prescriptions [place] Controlled Substance Registry consulted? Not Applicable   [name], [name], DO 02/05/18 1213

## 2018-03-08 ENCOUNTER — Other Ambulatory Visit: Payer: Self-pay

## 2018-03-08 ENCOUNTER — Ambulatory Visit
Admission: EM | Admit: 2018-03-08 | Discharge: 2018-03-08 | Disposition: A | Discharge status: Home / Self Care | Payer: BLUE CROSS/BLUE SHIELD | Attending: Internal Medicine | Admitting: Internal Medicine

## 2018-03-08 ENCOUNTER — Encounter: Payer: Self-pay | Admitting: Emergency Medicine

## 2018-03-08 DIAGNOSIS — R42 Dizziness and giddiness: Secondary | ICD-10-CM | POA: Diagnosis not present

## 2018-03-08 DIAGNOSIS — F321 Major depressive disorder, single episode, moderate: Secondary | ICD-10-CM | POA: Diagnosis not present

## 2018-03-08 DIAGNOSIS — R531 Weakness: Secondary | ICD-10-CM

## 2018-03-08 DIAGNOSIS — J111 Influenza due to unidentified influenza virus with other respiratory manifestations: Secondary | ICD-10-CM

## 2018-03-08 LAB — BASIC METABOLIC PANEL
ANION GAP: 8 (ref 5–15)
BUN: 10 mg/dL (ref 6–20)
CO2: 27 mmol/L (ref 22–32)
Calcium: 8.8 mg/dL — ABNORMAL LOW (ref 8.9–10.3)
Chloride: 101 mmol/L (ref 98–111)
Creatinine, Ser: 0.57 mg/dL (ref 0.44–1.00)
GFR calc Af Amer: >60 mL/min (ref >60)
GLUCOSE: 115 mg/dL — AB (ref 70–99)
POTASSIUM: 3.8 mmol/L (ref 3.5–5.1)
SODIUM: 136 mmol/L (ref 135–145)

## 2018-03-08 MED ORDER — OSELTAMIVIR PHOSPHATE 75 MG PO CAPS: 75.0000 mg | ORAL_CAPSULE | Freq: Two times a day (BID) | ORAL | 0 refills | Status: AC

## 2018-03-08 MED ORDER — BUSPIRONE HCL 5 MG PO TABS: 5.0000 mg | ORAL_TABLET | Freq: Two times a day (BID) | ORAL | 11 refills | Status: AC

## 2018-03-08 NOTE — ED Provider Notes (Signed)
[facility]    CSN: [id number] Arrival date & time: 03/08/18  1452     History   Chief Complaint Chief Complaint  Patient presents with  . Weakness    HPI Megan Novak is a 43 y.o. female history of depression on BuSpar comes to the urgent care with complaints of generalized weakness, loss of appetite and feeling very tired,dizzy, sad, tearfulness which has been ongoing for a while now.  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  She also lost her sister sometime last year.  Her appetite has been poor she has lost interest in things that she normally enjoys doing and has been very tearful recently.  Her mother was diagnosed with influenza recently and she is currently on Tamiflu.  The patient started having subjective fever about 48 hours or so ago.  No nausea or vomiting..  She has had 2 episodes of loose bowel movements over the past few days.  Past Medical History:  Diagnosis Date  . Anxiety   . Hypertension   . Hyperthyroidism   . Panic attacks     Patient Active Problem List   Diagnosis Date Noted  . Abdominal pain in pregnancy, antepartum 11/29/2014    History reviewed. No pertinent surgical history.  OB History    Gravida  3   Para  2   Term  2   Preterm  0   AB  0   Living  2     SAB  0   TAB  0   Ectopic  0   Multiple  0   Live Births               Home Medications    Prior to Admission medications   Medication Sig Start Date End Date Taking? Authorizing Provider  amLODipine (NORVASC) 5 MG tablet Take 5 mg by mouth daily.   Yes [provider]  busPIRone (BUSPAR) 5 MG tablet Take by mouth. 09/11/17 09/11/18 Yes [provider]  lisinopril (PRINIVIL,ZESTRIL) 40 MG tablet Take 40 mg by mouth daily.   Yes [provider]    Family History Family History  Problem Relation Age of Onset  . Diabetes Mother   . Hypertension Mother   . Thyroid disease Mother   . CAD Mother     . Cancer Father   . Heart attack Sister     Social History Social History   Tobacco Use  . Smoking status: Never Smoker  . Smokeless tobacco: Never Used  Substance Use Topics  . Alcohol use: Yes    Comment: ocassional wine  . Drug use: No     Allergies   Metronidazole   Review of Systems Review of Systems  Constitutional: Positive for activity change, appetite change and fatigue. Negative for chills and fever.  HENT: Negative for congestion, ear discharge, ear pain, mouth sores, rhinorrhea and sore throat.   Respiratory: Negative for cough, chest tightness and shortness of breath.   Gastrointestinal: Negative for abdominal distention, abdominal pain, constipation and nausea.  Endocrine: Negative for cold intolerance.  Genitourinary: Negative for dysuria, frequency and urgency.  Musculoskeletal: Negative for arthralgias and myalgias.  Skin: Negative for rash and wound.  Allergic/Immunologic: Negative for environmental allergies.  Neurological: Negative for dizziness, weakness, light-headedness and numbness.  Hematological: Negative for adenopathy.  Psychiatric/Behavioral: Positive for decreased concentration. Negative for agitation, confusion, hallucinations and suicidal ideas. The patient is nervous/anxious. The patient is not hyperactive.  Physical Exam Triage Vital Signs ED Triage Vitals  Enc Vitals Group     BP 03/08/18 1548 (!) 141/82     Pulse Rate 03/08/18 1548 66     Resp 03/08/18 1548 18     Temp 03/08/18 1548 98.1 F (36.7 C)     Temp Source 03/08/18 1548 Oral     SpO2 03/08/18 1548 100 %     Weight 03/08/18 1552 127 lb (57.6 kg)     Height 03/08/18 1552  (1.6 m)     Head Circumference --      Peak Flow --      Pain Score 03/08/18 1551 0     Pain Loc --      Pain Edu? --      Excl. in GC? --    No data found.  Updated Vital Signs BP (!) 141/82 (BP Location: Left Arm)   Pulse 66   Temp 98.1 F (36.7 C) (Oral)   Resp 18   Ht   (1.6 m)   Wt 57.6 kg   LMP 03/01/2018 (Exact Date)   SpO2 100%   BMI 22.50 kg/m   Visual Acuity Right Eye Distance:   Left Eye Distance:   Bilateral Distance:    Right Eye Near:   Left Eye Near:    Bilateral Near:     Physical Exam Constitutional:      General: She is not in acute distress.    Appearance: She is normal weight. She is not ill-appearing.  HENT:     Right Ear: Tympanic membrane normal.     Left Ear: Tympanic membrane normal.     Nose: Nose normal. No congestion or rhinorrhea.     Mouth/Throat:     Mouth: Mucous membranes are moist.     Pharynx: No posterior oropharyngeal erythema.  Eyes:     Conjunctiva/sclera: Conjunctivae normal.  Neck:     Musculoskeletal: Normal range of motion. No muscular tenderness.  Cardiovascular:     Rate and Rhythm: Normal rate and regular rhythm.     Pulses: Normal pulses.     Heart sounds: Normal heart sounds.  Pulmonary:     Effort: Pulmonary effort is normal.     Breath sounds: Normal breath sounds.  Abdominal:     General: Bowel sounds are normal.     Palpations: Abdomen is soft.  Musculoskeletal: Normal range of motion.  Lymphadenopathy:     Cervical: No cervical adenopathy.  Skin:    General: Skin is warm.     Capillary Refill: Capillary refill takes less than 2 seconds.     Findings: No lesion or rash.  Neurological:     General: No focal deficit present.     Mental Status: She is alert and oriented to person, place, and time.  Psychiatric:     Comments: Depressed Mood and flat affect      UC Treatments / Results  Labs (all labs ordered are listed, but only abnormal results are displayed) Labs Reviewed - No data to display  EKG None  Radiology No results found.  Procedures Procedures (including critical care time)  Medications Ordered in UC Medications - No data to display  Initial Impression / Assessment and Plan / UC Course  I have reviewed the triage vital signs and the nursing  notes.  Pertinent labs & imaging results that were available during my care of the patient were reviewed by me and considered in my medical decision making (see chart  for details).     1.  Influenza infection: Tamiflu Tylenol/NSAID for fever/body aches  2.  Moderate depression without suicidal or homicidal ideation: Patient will schedule an appointment with a counselor Increase BuSpar to 5 mg twice daily Vitamin D level  Final Clinical Impressions(s) / UC Diagnoses   Final diagnoses:  None   Discharge Instructions   None    ED Prescriptions    None     Controlled Substance Prescriptions [place] Controlled Substance Registry consulted? No   Merrilee Jansky, MD 03/09/18 1352

## 2018-03-08 NOTE — ED Triage Notes (Signed)
Patient states she has been weak and had no appetite since Friday.  Patient states she is very tired and dizzy at times

## 2018-03-10 LAB — VITAMIN D 25 HYDROXY (VIT D DEFICIENCY, FRACTURES): Vit D, 25-Hydroxy: 17.1 ng/mL — ABNORMAL LOW (ref 30.0–100.0)

## 2018-03-15 ENCOUNTER — Telehealth (HOSPITAL_COMMUNITY): Payer: Self-pay | Admitting: Emergency Medicine

## 2018-03-15 MED ORDER — VITAMIN D (ERGOCALCIFEROL) 1.25 MG (50000 UNIT) PO CAPS: 50000.0000 [IU] | ORAL_CAPSULE | ORAL | 0 refills | Status: AC

## 2018-03-15 MED ORDER — VITAMIN D (ERGOCALCIFEROL) 1.25 MG (50000 UNIT) PO CAPS: 50000.0000 [IU] | ORAL_CAPSULE | ORAL | 0 refills | Status: DC

## 2018-03-15 NOTE — Telephone Encounter (Signed)
Patient called back, made aware of labs. All questions answered. Sent to correct pharmacy.

## 2018-03-15 NOTE — Telephone Encounter (Signed)
Reviewed results with Dr. Leonides Grills. Patient needs 50,000 units of Vit D3 every week for 6 weeks. Will send in script. Attempted to reach patient. No answer at this time. Voicemail left.

## 2018-04-24 ENCOUNTER — Ambulatory Visit
Admission: EM | Admit: 2018-04-24 | Discharge: 2018-04-24 | Disposition: A | Discharge status: Home / Self Care | Payer: BLUE CROSS/BLUE SHIELD | Attending: Urgent Care | Admitting: Urgent Care

## 2018-04-24 DIAGNOSIS — W0110XA Fall on same level from slipping, tripping and stumbling with subsequent striking against unspecified object, initial encounter: Secondary | ICD-10-CM

## 2018-04-24 DIAGNOSIS — I10 Essential (primary) hypertension: Secondary | ICD-10-CM

## 2018-04-24 DIAGNOSIS — R42 Dizziness and giddiness: Secondary | ICD-10-CM | POA: Diagnosis not present

## 2018-04-24 LAB — BASIC METABOLIC PANEL
Anion gap: 7 (ref 5–15)
BUN: 6 mg/dL (ref 6–20)
CO2: 24 mmol/L (ref 22–32)
Calcium: 9 mg/dL (ref 8.9–10.3)
Chloride: 104 mmol/L (ref 98–111)
Creatinine, Ser: 0.51 mg/dL (ref 0.44–1.00)
GFR calc Af Amer: >60 mL/min (ref >60)
GFR calc non Af Amer: >60 mL/min (ref >60)
Glucose, Bld: 121 mg/dL — ABNORMAL HIGH (ref 70–99)
Potassium: 3.8 mmol/L (ref 3.5–5.1)
Sodium: 135 mmol/L (ref 135–145)

## 2018-04-24 LAB — CBC WITH DIFFERENTIAL/PLATELET
Abs Immature Granulocytes: 0.02 10*3/uL (ref 0.00–0.07)
Basophils Absolute: 0 10*3/uL (ref 0.0–0.1)
Basophils Relative: 0 %
Eosinophils Absolute: 0 10*3/uL (ref 0.0–0.5)
Eosinophils Relative: 0 %
HCT: 37.9 % (ref 36.0–46.0)
Hemoglobin: 12.4 g/dL (ref 12.0–15.0)
Immature Granulocytes: 0 %
Lymphocytes Relative: 13 %
Lymphs Abs: 1 10*3/uL (ref 0.7–4.0)
MCH: 28.5 pg (ref 26.0–34.0)
MCHC: 32.7 g/dL (ref 30.0–36.0)
MCV: 87.1 fL (ref 80.0–100.0)
Monocytes Absolute: 0.3 10*3/uL (ref 0.1–1.0)
Monocytes Relative: 4 %
Neutro Abs: 6.7 10*3/uL (ref 1.7–7.7)
Neutrophils Relative %: 83 %
Platelets: 353 10*3/uL (ref 150–400)
RBC: 4.35 MIL/uL (ref 3.87–5.11)
RDW: 13 % (ref 11.5–15.5)
WBC: 8.1 10*3/uL (ref 4.0–10.5)
nRBC: 0 % (ref 0.0–0.2)

## 2018-04-24 LAB — TROPONIN I: Troponin I: <0.03 ng/mL (ref <0.03)

## 2018-04-24 NOTE — ED Provider Notes (Signed)
[address] [place], [place] [address] [phone]   Name: Megan Novak DOB: 12-15-1975 MRN: [id number] CSN: [id number]  Arrival date and time:  04/24/18 1033  Chief Complaint:  Dizziness  NOTE: Prior to seeing the patient today, I have reviewed the triage nursing documentation and vital signs. Clinical staff has updated patient's PMH/PSHx, current medication list, and drug allergies/intolerances to ensure comprehensive history available to assist in medical decision making.   History:   HPI: Megan Novak is a 43 y.o. female who presents today with complaints of profound vertiginous symptoms that led to a fall earlier this morning. Patient notes that when she woke up this morning, she felt "fine". She bent down to let her dog out of a cage and she began to feel dizzy. Upon standing, patient notes that the room starting "spinning sideways". Patient fell to the floor and struck the LEFT side of her forehead on "either the floor or the door". Patient denies LOC. She was assisted back to bed by her SO. BP checked and found to be elevated at 175/101. Patient notes that she was "really anxious". PMH significant for anxiety. Patient took her Buspar and regular medications. BP was monitored and noted to be reducing appropriately. Throughout her episode this morning, patient denies that she experienced any chest pain, SOB, or palpitations. No recent illnesses. Appetite has been labile secondary to anxiety and depression diagnosis. Fluid intake yesterday was not adequate per patient report.   Patient called PCP Clent Ridges, MD) at [facility]. Triage line advised patient to be seen by [facility] within 24 hours.   Past Medical History:  Diagnosis Date  . Anxiety   . Hypertension   . Hyperthyroidism   . Panic attacks     History reviewed. No pertinent surgical history.  Family History  Problem Relation Age of Onset  . Diabetes Mother   . Hypertension Mother   . Thyroid disease Mother    . CAD Mother   . Cancer Father   . Heart attack Sister     Social History   Socioeconomic History  . Marital status: Single    Spouse name: Not on file  . Number of children: Not on file  . Years of education: Not on file  . Highest education level: Not on file  Occupational History  . Not on file  Social Needs  . Financial resource strain: Not on file  . Food insecurity:    Worry: Not on file    Inability: Not on file  . Transportation needs:    Medical: Not on file    Non-medical: Not on file  Tobacco Use  . Smoking status: Never Smoker  . Smokeless tobacco: Never Used  Substance and Sexual Activity  . Alcohol use: Yes    Comment: ocassional wine  . Drug use: No  . Sexual activity: Yes    Birth control/protection: None  Lifestyle  . Physical activity:    Days per week: Not on file    Minutes per session: Not on file  . Stress: Not on file  Relationships  . Social connections:    Talks on phone: Not on file    Gets together: Not on file    Attends religious service: Not on file    Active member of club or organization: Not on file    Attends meetings of clubs or organizations: Not on file    Relationship status: Not on file  . Intimate partner violence:    Fear of  current or ex partner: Not on file    Emotionally abused: Not on file    Physically abused: Not on file    Forced sexual activity: Not on file  Other Topics Concern  . Not on file  Social History Narrative  . Not on file    Patient Active Problem List   Diagnosis Date Noted  . Abdominal pain in pregnancy, antepartum 11/29/2014    Home Medications:    Current Meds  Medication Sig  . amLODipine (NORVASC) 5 MG tablet Take 5 mg by mouth daily.  . busPIRone (BUSPAR) 5 MG tablet Take 1 tablet (5 mg total) by mouth 2 (two) times daily.  Marland Kitchen. lisinopril (PRINIVIL,ZESTRIL) 40 MG tablet Take 40 mg by mouth daily.  . Vitamin D, Ergocalciferol, (DRISDOL) 1.25 MG (50000 UT) CAPS capsule Take 1 capsule  (50,000 Units total) by mouth every 7 (seven) days.    Allergies:   Metronidazole  Review of Systems (ROS): Review of Systems  Constitutional: Positive for appetite change. Negative for chills and fever.  HENT:       Hematoma to LEFT forehead s/p fall  Eyes: Negative for pain and visual disturbance.  Respiratory: Negative for cough, chest tightness and shortness of breath.   Cardiovascular: Negative for chest pain and palpitations.  Gastrointestinal: Negative for diarrhea, nausea and vomiting.  Skin: Negative for color change and rash.  Neurological: Positive for dizziness and syncope. Negative for headaches.  Psychiatric/Behavioral: The patient is nervous/anxious.   All other systems reviewed and are negative.    Physical Exam:  Triage Vital Signs ED Triage Vitals  Enc Vitals Group     BP 04/24/18 1040 127/75     Pulse Rate 04/24/18 1040 85     Resp 04/24/18 1040 18     Temp 04/24/18 1040 98.9 F (37.2 C)     Temp Source 04/24/18 1040 Oral     SpO2 04/24/18 1040 100 %     Weight 04/24/18 1044 130 lb (59 kg)     Height 04/24/18 1044 5\' 3"  (1.6 m)     Head Circumference --      Peak Flow --      Pain Score 04/24/18 1043 8     Pain Loc --      Pain Edu? --      Excl. in [place]? --     Physical Exam  Constitutional: She is oriented to person, place, and time and well-developed, well-nourished, and in no distress.  HENT:  Head: Normocephalic. Head is with contusion (LEFT forehead s/p fall).  Mouth/Throat: Oropharynx is clear and moist and mucous membranes are normal.  Eyes: Pupils are equal, round, and reactive to light. EOM are normal.  Neck: Normal range of motion. Neck supple. No tracheal deviation present.  Cardiovascular: Normal rate, regular rhythm, normal heart sounds and intact distal pulses. Exam reveals no gallop and no friction rub.  No murmur heard. Pulmonary/Chest: Effort normal and breath sounds normal. No respiratory distress. She has no wheezes. She has no  rales.  Neurological: She is alert and oriented to person, place, and time. She has normal sensation, normal strength, normal reflexes and intact cranial nerves. GCS score is 15.  (+) vertiginous symptoms  Skin: Skin is warm and dry. No rash noted. No erythema.  Psychiatric: Affect and judgment normal. Her mood appears anxious.  Nursing note and vitals reviewed.    Urgent Care Treatments / Results:   LABS: PLEASE NOTE: all labs that were ordered this encounter  are listed, however only abnormal results are displayed. Labs Reviewed  BASIC METABOLIC PANEL - Abnormal; Notable for the following components:      Result Value   Glucose, Bld 121 (*)    All other components within normal limits  CBC WITH DIFFERENTIAL/PLATELET  TROPONIN I    EKG: Date: 04/24/2018 EKG Time: 11:16 Rate: 88 Rhythm: normal sinus rhythm ST&T Change: No elevation or depression Narrative Interpretation: Normal  RADIOLOGY: No results found.  PRODEDURES: Procedures  MEDICATIONS RECEIVED THIS VISIT: Medications - No data to display     Initial Impression / Assessment and Plan / Urgent Care Course:   Pertinent labs & imaging results that were available during my care of the patient were personally reviewed by me and considered in my medical decision making (see chart for details).   Megan Novak is a 43 y.o. female who presents to [facility] today with complaints of dizziness leading to a fall this morning. BP was initially high. Patient struck head on floor resulting in a forehead hematoma. No LOC or resulting headache. She advised at time of discharge that she missed her amlodipine yesterday, hence blood pressure spike this morning. She denies any chest pain, SOB, or palpitations. No focal neurological concerns.   Labs and EKG reviewed as normal. Presenting symptoms multifactorial in nature and could be related to missed antihypertensive, anxiety, and decreased fluid intake. Patient encouraged  to increase fluids, change positions slowly, and to monitor her BP. Stressed importance of taking medications as prescribed for her blood pressure.   Discussed follow up with primary care physician this week for re-evaluation. I have reviewed the follow up and strict return precautions for any new or worsening symptoms. Patient is aware of symptoms that would be deemed urgent/emergent, and would thus require further evaluation either here or in the emergency department. At the time of discharge, she verbalized understanding and consent with the discharge plan as it was reviewed with her. All questions were fielded by provider and/or clinic staff prior to patient discharge.    Final Clinical Impressions(s) / Urgent Care Diagnoses:   Final diagnoses:  Essential hypertension  Dizziness    New Prescriptions:  No orders of the defined types were placed in this encounter.   Controlled Substance Prescriptions:  [place] Controlled Substance Registry consulted? Not Applicable  NOTE: This note was prepared using Dragon dictation software along with smaller phrase technology. Despite my best ability to proofread, there is the potential that transcriptional errors may still occur from this process, and are completely unintentional.     Verlee Monte, NP 04/24/18 1206

## 2018-04-24 NOTE — ED Triage Notes (Signed)
Pt states this morning she woke up to let out the dog and as she bent over she felt dizzy and felt as if she was going to pass out. Didn't pass out but did fall and hit her head and was crawling on the floor when her bf helped her up. Does have a knot on the back on her head. Did put ice on her head and checked her bp and it was 175/101 and is on bp medication. Did call her pcp and they told her to monitor her bp.

## 2018-04-24 NOTE — Discharge Instructions (Addendum)
It was very nice meeting you today in clinic. Thank you for entrusting me with your care.   Increase your fluid intake. Monitor your blood pressure.   Make arrangements to follow up with your regular doctor and/or the specialist for re-evaluation and further treatment as dicussed. If your symptoms/condition worsens, please seek follow up care either here or in the ER. Please remember, our [facility] providers are "right here with you" when you need [place].   Again, it was my pleasure to take care of you today. Thank you for choosing our clinic. I hope that you start to feel better quickly.   Quentin Mulling, MSN, APRN, FNP-C, CEN Advanced Practice Provider [facility]

## 2018-06-07 ENCOUNTER — Encounter: Payer: Self-pay | Admitting: Emergency Medicine

## 2018-06-07 ENCOUNTER — Other Ambulatory Visit: Payer: Self-pay

## 2018-06-07 ENCOUNTER — Ambulatory Visit
Admission: EM | Admit: 2018-06-07 | Discharge: 2018-06-07 | Disposition: A | Discharge status: Home / Self Care | Payer: BLUE CROSS/BLUE SHIELD | Attending: Family Medicine | Admitting: Family Medicine

## 2018-06-07 DIAGNOSIS — S90862A Insect bite (nonvenomous), left foot, initial encounter: Secondary | ICD-10-CM

## 2018-06-07 DIAGNOSIS — W57XXXA Bitten or stung by nonvenomous insect and other nonvenomous arthropods, initial encounter: Secondary | ICD-10-CM

## 2018-06-07 MED ORDER — CEPHALEXIN 500 MG PO CAPS: 500.0000 mg | ORAL_CAPSULE | Freq: Two times a day (BID) | ORAL | 0 refills | Status: AC

## 2018-06-07 NOTE — ED Provider Notes (Signed)
[facility]    CSN: [id number] Arrival date & time: 06/07/18  1407     History   Chief Complaint Chief Complaint  Patient presents with  . Insect Bite    HPI Megan Novak is a 43 y.o. female.   43 yo female with a c/o insect bite to her left foot 2 days ago. States area is slight tender and itchy. Denies any fevers, chills, drainage.      Past Medical History:  Diagnosis Date  . Anxiety   . Hypertension   . Hyperthyroidism   . Panic attacks     Patient Active Problem List   Diagnosis Date Noted  . Abdominal pain in pregnancy, antepartum 11/29/2014    History reviewed. No pertinent surgical history.  OB History    Gravida  3   Para  2   Term  2   Preterm  0   AB  0   Living  2     SAB  0   TAB  0   Ectopic  0   Multiple  0   Live Births               Home Medications    Prior to Admission medications   Medication Sig Start Date End Date Taking? Authorizing Provider  amLODipine (NORVASC) 5 MG tablet Take 5 mg by mouth daily.   Yes [provider]  busPIRone (BUSPAR) 5 MG tablet Take 1 tablet (5 mg total) by mouth 2 (two) times daily. 03/08/18 03/08/19 Yes Lamptey, Britta MccreedyPhilip O, MD  lisinopril (PRINIVIL,ZESTRIL) 40 MG tablet Take 40 mg by mouth daily.   Yes [provider]  cephALEXin (KEFLEX) 500 MG capsule Take 1 capsule (500 mg total) by mouth 2 (two) times daily. 06/07/18   Payton Mccallumonty, Gethsemane Fischler, MD  oseltamivir (TAMIFLU) 75 MG capsule Take 1 capsule (75 mg total) by mouth every 12 (twelve) hours. 03/08/18   LampteyBritta Mccreedy, Philip O, MD  Vitamin D, Ergocalciferol, (DRISDOL) 1.25 MG (50000 UT) CAPS capsule Take 1 capsule (50,000 Units total) by mouth every 7 (seven) days. 03/15/18   LampteyBritta Mccreedy, Philip O, MD    Family History Family History  Problem Relation Age of Onset  . Diabetes Mother   . Hypertension Mother   . Thyroid disease Mother   . CAD Mother   . Cancer Father   . Heart attack Sister     Social History Social  History   Tobacco Use  . Smoking status: Never Smoker  . Smokeless tobacco: Never Used  Substance Use Topics  . Alcohol use: Yes    Comment: ocassional wine  . Drug use: No     Allergies   Metronidazole   Review of Systems Review of Systems   Physical Exam Triage Vital Signs ED Triage Vitals  Enc Vitals Group     BP 06/07/18 1421 128/73     Pulse Rate 06/07/18 1421 66     Resp 06/07/18 1421 18     Temp 06/07/18 1421 98.3 F (36.8 C)     Temp src --      SpO2 06/07/18 1421 100 %     Weight 06/07/18 1420 130 lb (59 kg)     Height 06/07/18 1420 5\' 3"  (1.6 m)     Head Circumference --      Peak Flow --      Pain Score 06/07/18 1420 0     Pain Loc --      Pain Edu? --  Excl. in GC? --    No data found.  Updated Vital Signs BP 128/73 (BP Location: Left Arm)   Pulse 66   Temp 98.3 F (36.8 C)   Resp 18   Ht 5\' 3"  (1.6 m)   Wt 59 kg   LMP 05/27/2018   SpO2 100%   BMI 23.03 kg/m   Visual Acuity Right Eye Distance:   Left Eye Distance:   Bilateral Distance:    Right Eye Near:   Left Eye Near:    Bilateral Near:     Physical Exam Vitals signs and nursing note reviewed.  Constitutional:      General: She is not in acute distress.    Appearance: She is not toxic-appearing or diaphoretic.  Skin:    Comments: Left foot skin with pinpoint puncture wound and mild surrounding erythema; no drainage  Neurological:     Mental Status: She is alert.      UC Treatments / Results  Labs (all labs ordered are listed, but only abnormal results are displayed) Labs Reviewed - No data to display  EKG None  Radiology No results found.  Procedures Procedures (including critical care time)  Medications Ordered in UC Medications - No data to display  Initial Impression / Assessment and Plan / UC Course  I have reviewed the triage vital signs and the nursing notes.  Pertinent labs & imaging results that were available during my care of the patient were  reviewed by me and considered in my medical decision making (see chart for details).      Final Clinical Impressions(s) / UC Diagnoses   Final diagnoses:  Insect bite of left foot, initial encounter     Discharge Instructions     Warm compresses to area Cortisone cream if itching    ED Prescriptions    Medication Sig Dispense Auth. Provider   cephALEXin (KEFLEX) 500 MG capsule Take 1 capsule (500 mg total) by mouth 2 (two) times daily. 10 capsule Payton Mccallum, MD      1. diagnosis reviewed with patient 2. rx as per orders above; reviewed possible side effects, interactions, risks and benefits  3. Recommend supportive treatment as above 4. Follow-up prn if symptoms worsen or don't improve  Controlled Substance Prescriptions [place] Controlled Substance Registry consulted? Not Applicable   Payton Mccallum, MD 06/07/18 1705

## 2018-06-07 NOTE — ED Triage Notes (Signed)
Patient  States she was outside and something bit her on her left foot.  Patient is concerned because it is itchy and swollen

## 2018-06-07 NOTE — Discharge Instructions (Signed)
Warm compresses to area Cortisone cream if itching

## 2018-07-19 ENCOUNTER — Other Ambulatory Visit: Payer: Self-pay

## 2018-07-19 ENCOUNTER — Ambulatory Visit
Admission: EM | Admit: 2018-07-19 | Discharge: 2018-07-19 | Disposition: A | Discharge status: Home / Self Care | Payer: BLUE CROSS/BLUE SHIELD | Attending: Family Medicine | Admitting: Family Medicine

## 2018-07-19 DIAGNOSIS — S93421A Sprain of deltoid ligament of right ankle, initial encounter: Secondary | ICD-10-CM | POA: Diagnosis not present

## 2018-07-19 DIAGNOSIS — Y92009 Unspecified place in unspecified non-institutional (private) residence as the place of occurrence of the external cause: Secondary | ICD-10-CM | POA: Diagnosis not present

## 2018-07-19 NOTE — ED Triage Notes (Signed)
Patient complains of right foot arch pain that started yesterday morning when she woke up. Patient states that she has noticed some swelling in her foot. No injury known

## 2018-07-19 NOTE — Discharge Instructions (Addendum)
Rest, ice, compression, elevation Over the counter ibuprofen 600mg  three times daily

## 2018-07-19 NOTE — ED Provider Notes (Signed)
[facility]    CSN: [id number] Arrival date & time: 07/19/18  1700     History   Chief Complaint Chief Complaint  Patient presents with  . Foot Pain    HPI Megan Novak is a 43 y.o. female.   43 yo female with a c/o right foot arch pain and medial ankle pain and swelling since yesterday. States that over the weekend she did some dancing. A day later was wearing high heels and the heel of her shoe slipped through a crack on the porch. States she's not sure if she twisted it then as she did not feel like she had injured it. No direct traumatic injury.    Foot Pain    Past Medical History:  Diagnosis Date  . Anxiety   . Hypertension   . Hyperthyroidism   . Panic attacks     Patient Active Problem List   Diagnosis Date Noted  . Abdominal pain in pregnancy, antepartum 11/29/2014    History reviewed. No pertinent surgical history.  OB History    Gravida  3   Para  2   Term  2   Preterm  0   AB  0   Living  2     SAB  0   TAB  0   Ectopic  0   Multiple  0   Live Births               Home Medications    Prior to Admission medications   Medication Sig Start Date End Date Taking? Authorizing Provider  amLODipine (NORVASC) 5 MG tablet Take 5 mg by mouth daily.   Yes [provider]  busPIRone (BUSPAR) 5 MG tablet Take 1 tablet (5 mg total) by mouth 2 (two) times daily. 03/08/18 03/08/19 Yes Lamptey, Britta MccreedyPhilip O, MD  lisinopril (PRINIVIL,ZESTRIL) 40 MG tablet Take 40 mg by mouth daily.   Yes [provider]  cephALEXin (KEFLEX) 500 MG capsule Take 1 capsule (500 mg total) by mouth 2 (two) times daily. 06/07/18   Payton Mccallumonty, Evelyna Folker, MD  oseltamivir (TAMIFLU) 75 MG capsule Take 1 capsule (75 mg total) by mouth every 12 (twelve) hours. 03/08/18   LampteyBritta Mccreedy, Philip O, MD  Vitamin D, Ergocalciferol, (DRISDOL) 1.25 MG (50000 UT) CAPS capsule Take 1 capsule (50,000 Units total) by mouth every 7 (seven) days. 03/15/18   LampteyBritta Mccreedy, Philip O, MD     Family History Family History  Problem Relation Age of Onset  . Diabetes Mother   . Hypertension Mother   . Thyroid disease Mother   . CAD Mother   . Cancer Father   . Heart attack Sister     Social History Social History   Tobacco Use  . Smoking status: Never Smoker  . Smokeless tobacco: Never Used  Substance Use Topics  . Alcohol use: Yes    Comment: occasional wine  . Drug use: No     Allergies   Metronidazole   Review of Systems Review of Systems   Physical Exam Triage Vital Signs ED Triage Vitals  Enc Vitals Group     BP 07/19/18 1728 128/82     Pulse Rate 07/19/18 1728 61     Resp 07/19/18 1728 16     Temp 07/19/18 1728 98.3 F (36.8 C)     Temp Source 07/19/18 1728 Oral     SpO2 07/19/18 1728 100 %     Weight 07/19/18 1726 130 lb (59 kg)  Height 07/19/18 1726 5\' 2"  (1.575 m)     Head Circumference --      Peak Flow --      Pain Score 07/19/18 1725 6     Pain Loc --      Pain Edu? --      Excl. in [place]? --    No data found.  Updated Vital Signs BP 128/82 (BP Location: Left Arm)   Pulse 61   Temp 98.3 F (36.8 C) (Oral)   Resp 16   Ht 5\' 2"  (1.575 m)   Wt 59 kg   LMP 07/11/2018   SpO2 100%   BMI 23.78 kg/m   Visual Acuity Right Eye Distance:   Left Eye Distance:   Bilateral Distance:    Right Eye Near:   Left Eye Near:    Bilateral Near:     Physical Exam Vitals signs and nursing note reviewed.  Constitutional:      General: She is not in acute distress.    Appearance: She is not toxic-appearing or diaphoretic.  Musculoskeletal:     Right ankle: She exhibits swelling (medial soft tissues). She exhibits normal range of motion, no ecchymosis, no deformity, no laceration and normal pulse. Tenderness (over the medial soft tissues). No lateral malleolus, no medial malleolus, no head of 5th metatarsal and no proximal fibula tenderness found. Achilles tendon normal.  Neurological:     Mental Status: She is alert.      UC  Treatments / Results  Labs (all labs ordered are listed, but only abnormal results are displayed) Labs Reviewed - No data to display  EKG   Radiology No results found.  Procedures Procedures (including critical care time)  Medications Ordered in UC Medications - No data to display  Initial Impression / Assessment and Plan / UC Course  I have reviewed the triage vital signs and the nursing notes.  Pertinent labs & imaging results that were available during my care of the patient were reviewed by me and considered in my medical decision making (see chart for details).      Final Clinical Impressions(s) / UC Diagnoses   Final diagnoses:  Sprain of right medial ankle joint, initial encounter     Discharge Instructions     Rest, ice, compression, elevation Over the counter ibuprofen 600mg  three times daily    ED Prescriptions    None     1.diagnosis reviewed with patient 2. Recommend supportive treatment as above 3. Follow-up prn if symptoms worsen or don't improve   Controlled Substance Prescriptions [place] Controlled Substance Registry consulted? Not Applicable   Norval Gable, MD 07/19/18 1754

## 2023-02-04 ENCOUNTER — Ambulatory Visit
Admission: RE | Admit: 2023-02-04 | Discharge: 2023-02-04 | Disposition: A | Discharge status: Home / Self Care | Payer: BC Managed Care – PPO | Source: Ambulatory Visit | Attending: Emergency Medicine | Admitting: Emergency Medicine

## 2023-02-04 VITALS — BP 121/80 | HR 71 | Temp 98.8°F | Resp 18

## 2023-02-04 DIAGNOSIS — J069 Acute upper respiratory infection, unspecified: Secondary | ICD-10-CM | POA: Diagnosis not present

## 2023-02-04 LAB — POC COVID19/FLU A&B COMBO
Covid Antigen, POC: NEGATIVE
Influenza A Antigen, POC: NEGATIVE
Influenza B Antigen, POC: NEGATIVE

## 2023-02-04 NOTE — Discharge Instructions (Addendum)
The COVID and flu tests are negative.   Take Tylenol or ibuprofen as needed for fever or discomfort.  Take plain Mucinex as needed for congestion.  Rest and keep yourself hydrated.    Follow-up with your primary care provider if your symptoms are not improving.

## 2023-02-04 NOTE — ED Provider Notes (Signed)
Renaldo Fiddler    CSN: [id number] Arrival date & time: 02/04/23  1856      History   Chief Complaint Chief Complaint  Patient presents with   Cough    I don't really have a cough. Just a lot of stuffiness and sneezing - Entered by patient    HPI Megan Novak is a 48 y.o. female.  Patient presents with nasal congestion, runny nose, sneezing since this morning.  No fever, cough, shortness of breath, vomiting, diarrhea.  Treatment at home with Mucinex.  The history is provided by the patient and medical records.    Past Medical History:  Diagnosis Date   Anxiety    Hypertension    Hyperthyroidism    Panic attacks     Patient Active Problem List   Diagnosis Date Noted   Abdominal pain in pregnancy, antepartum 11/29/2014    History reviewed. No pertinent surgical history.  OB History     Gravida  3   Para  2   Term  2   Preterm  0   AB  0   Living  2      SAB  0   IAB  0   Ectopic  0   Multiple  0   Live Births               Home Medications    Prior to Admission medications   Medication Sig Start Date End Date Taking? Authorizing Provider  amLODipine (NORVASC) 5 MG tablet Take 5 mg by mouth daily.    [provider]  cephALEXin (KEFLEX) 500 MG capsule Take 1 capsule (500 mg total) by mouth 2 (two) times daily. 06/07/18   Payton Mccallum, MD  lisinopril (PRINIVIL,ZESTRIL) 40 MG tablet Take 40 mg by mouth daily.    [provider]  oseltamivir (TAMIFLU) 75 MG capsule Take 1 capsule (75 mg total) by mouth every 12 (twelve) hours. 03/08/18   LampteyBritta Mccreedy, MD  Vitamin D, Ergocalciferol, (DRISDOL) 1.25 MG (50000 UT) CAPS capsule Take 1 capsule (50,000 Units total) by mouth every 7 (seven) days. 03/15/18   Lamptey, Britta Mccreedy, MD    Family History Family History  Problem Relation Age of Onset   Diabetes Mother    Hypertension Mother    Thyroid disease Mother    CAD Mother    Cancer Father    Heart attack Sister      Social History Social History   Tobacco Use   Smoking status: Never   Smokeless tobacco: Never  Vaping Use   Vaping status: Never Used  Substance Use Topics   Alcohol use: Yes    Comment: occasional wine   Drug use: No     Allergies   Metronidazole   Review of Systems Review of Systems  Constitutional:  Negative for chills and fever.  HENT:  Positive for congestion, rhinorrhea and sneezing. Negative for ear pain and sore throat.   Respiratory:  Negative for cough and shortness of breath.   Gastrointestinal:  Negative for diarrhea and vomiting.     Physical Exam Triage Vital Signs ED Triage Vitals  Encounter Vitals Group     BP      Systolic BP Percentile      Diastolic BP Percentile      Pulse      Resp      Temp      Temp src      SpO2      Weight  Height      Head Circumference      Peak Flow      Pain Score      Pain Loc      Pain Education      Exclude from Growth Chart    No data found.  Updated Vital Signs BP 121/80   Pulse 71   Temp 98.8 F (37.1 C)   Resp 18   SpO2 97%   Visual Acuity Right Eye Distance:   Left Eye Distance:   Bilateral Distance:    Right Eye Near:   Left Eye Near:    Bilateral Near:     Physical Exam HENT:     Right Ear: Tympanic membrane normal.     Left Ear: Tympanic membrane normal.     Nose: Rhinorrhea present.     Mouth/Throat:     Mouth: Mucous membranes are moist.     Pharynx: Oropharynx is clear.  Cardiovascular:     Rate and Rhythm: Normal rate and regular rhythm.     Heart sounds: Normal heart sounds.  Pulmonary:     Effort: Pulmonary effort is normal. No respiratory distress.     Breath sounds: Normal breath sounds.  Neurological:     Mental Status: She is alert.      UC Treatments / Results  Labs (all labs ordered are listed, but only abnormal results are displayed) Labs Reviewed  POC COVID19/FLU A&B COMBO    EKG   Radiology No results found.  Procedures Procedures  (including critical care time)  Medications Ordered in UC Medications - No data to display  Initial Impression / Assessment and Plan / UC Course  I have reviewed the triage vital signs and the nursing notes.  Pertinent labs & imaging results that were available during my care of the patient were reviewed by me and considered in my medical decision making (see chart for details).    Viral URI.  Rapid COVID and flu negative.  Discussed symptomatic treatment including Tylenol or ibuprofen as needed for fever or discomfort, plain Mucinex as needed for congestion, rest, hydration.  Instructed patient to follow-up with PCP if not improving.  ED precautions given.  Patient agrees to plan of care.   Final Clinical Impressions(s) / UC Diagnoses   Final diagnoses:  Viral URI     Discharge Instructions      The COVID and flu tests are negative.   Take Tylenol or ibuprofen as needed for fever or discomfort.  Take plain Mucinex as needed for congestion.  Rest and keep yourself hydrated.    Follow-up with your primary care provider if your symptoms are not improving.         ED Prescriptions   None    PDMP not reviewed this encounter.   Mickie Bail, NP 02/04/23 1943

## 2024-02-03 ENCOUNTER — Ambulatory Visit: Admission: RE | Admit: 2024-02-03 | Discharge: 2024-02-03 | Disposition: A | Discharge status: Home / Self Care

## 2024-02-03 ENCOUNTER — Ambulatory Visit (INDEPENDENT_AMBULATORY_CARE_PROVIDER_SITE_OTHER)

## 2024-02-03 VITALS — BP 128/84 | HR 63 | Temp 98.1°F | Resp 18

## 2024-02-03 DIAGNOSIS — M25561 Pain in right knee: Secondary | ICD-10-CM

## 2024-02-03 NOTE — ED Provider Notes (Signed)
 " Megan Novak    CSN: [id number] Arrival date & time: 02/03/24  1002      History   Chief Complaint Chief Complaint  Patient presents with   Knee Injury    [place] in a parking lot tonight after slipping on ice. Fell on my right knee. I iced it off and on but still in quite a bit of pain. - Entered by patient    HPI Megan Novak is a 49 y.o. female.  Patient presents with right knee pain after she accidentally fell on the ice in a parking lot last night.  Her knee pain is worse with weightbearing and ambulation and bending her knee.  No open wounds, numbness, weakness.  Patient states she noted swelling last night.  Treatment attempted with Tylenol. Patient was seen by [facility] orthopedics in [place] on 01/27/2024; diagnosed with injury of peroneal tendon of right foot.  The history is provided by the patient and medical records.    Past Medical History:  Diagnosis Date   Anxiety    Hypertension    Hyperthyroidism    Panic attacks     Patient Active Problem List   Diagnosis Date Noted   Abdominal pain in pregnancy, antepartum 11/29/2014    History reviewed. No pertinent surgical history.  OB History     Gravida  3   Para  2   Term  2   Preterm  0   AB  0   Living  2      SAB  0   IAB  0   Ectopic  0   Multiple  0   Live Births               Home Medications    Prior to Admission medications  Medication Sig Start Date End Date Taking? Authorizing Provider  amLODipine  (NORVASC ) 5 MG tablet Take 5 mg by mouth daily.   Yes [provider]  sertraline (ZOLOFT) 100 MG tablet Take 100 mg by mouth at bedtime. 01/14/24 01/13/25 Yes [provider]  cephALEXin  (KEFLEX ) 500 MG capsule Take 1 capsule (500 mg total) by mouth 2 (two) times daily. 06/07/18   Servando Hire, MD  lisinopril (PRINIVIL,ZESTRIL) 40 MG tablet Take 40 mg by mouth daily. Patient not taking: Reported on 02/03/2024    [provider]  oseltamivir  (TAMIFLU ) 75  MG capsule Take 1 capsule (75 mg total) by mouth every 12 (twelve) hours. 03/08/18   Lamptey, Aleene KIDD, MD  Vitamin D , Ergocalciferol , (DRISDOL ) 1.25 MG (50000 UT) CAPS capsule Take 1 capsule (50,000 Units total) by mouth every 7 (seven) days. 03/15/18   Lamptey, Aleene KIDD, MD    Family History Family History  Problem Relation Age of Onset   Diabetes Mother    Hypertension Mother    Thyroid disease Mother    CAD Mother    Cancer Father    Heart attack Sister     Social History Social History[1]   Allergies   Metronidazole   Review of Systems Review of Systems  Musculoskeletal:  Positive for arthralgias, gait problem and joint swelling.  Skin:  Negative for color change, rash and wound.  Neurological:  Negative for weakness and numbness.     Physical Exam Triage Vital Signs ED Triage Vitals  Encounter Vitals Group     BP      Girls Systolic BP Percentile      Girls Diastolic BP Percentile      Boys Systolic BP  Percentile      Boys Diastolic BP Percentile      Pulse      Resp      Temp      Temp src      SpO2      Weight      Height      Head Circumference      Peak Flow      Pain Score      Pain Loc      Pain Education      Exclude from Growth Chart    No data found.  Updated Vital Signs BP 128/84   Pulse 63   Temp 98.1 F (36.7 C)   Resp 18   LMP 01/30/2024 (Approximate)   SpO2 97%   Visual Acuity Right Eye Distance:   Left Eye Distance:   Bilateral Distance:    Right Eye Near:   Left Eye Near:    Bilateral Near:     Physical Exam Constitutional:      General: She is not in acute distress. HENT:     Mouth/Throat:     Mouth: Mucous membranes are moist.  Cardiovascular:     Rate and Rhythm: Normal rate.  Pulmonary:     Effort: Pulmonary effort is normal. No respiratory distress.  Musculoskeletal:        General: Tenderness present. No swelling or deformity. Normal range of motion.     Comments: Tender to palpation of right patella.  No  wounds, ecchymosis, edema.  RLE: FROM, sensation intact, strength 5/5.  Skin:    General: Skin is warm and dry.     Capillary Refill: Capillary refill takes less than 2 seconds.     Findings: No bruising, erythema, lesion or rash.  Neurological:     General: No focal deficit present.     Mental Status: She is alert.     Sensory: No sensory deficit.     Motor: No weakness.     Gait: Gait abnormal.     Comments: Limping gait.      UC Treatments / Results  Labs (all labs ordered are listed, but only abnormal results are displayed) Labs Reviewed - No data to display  EKG   Radiology DG Knee Complete 4 Views Right Result Date: 02/03/2024 EXAM: 4 VIEW(S) XRAY OF THE KNEE 02/03/2024 10:44:23 AM COMPARISON: None available. CLINICAL HISTORY: Pain status post fall on ice. FINDINGS: BONES AND JOINTS: No acute fracture. No malalignment. Suspected knee effusion in the suprapatellar bursa based on the slightly off-axis lateral projection. Minimal marginal spurring of the patella. SOFT TISSUES: Unremarkable. IMPRESSION: 1. Suspected suprapatellar knee joint effusion. 2. Minimal marginal spurring of the patella. Electronically signed by: Ryan Salvage MD 02/03/2024 11:31 AM EST RP Workstation: [id number]    Procedures Procedures (including critical care time)  Medications Ordered in UC Medications - No data to display  Initial Impression / Assessment and Plan / UC Course  I have reviewed the triage vital signs and the nursing notes.  Pertinent labs & imaging results that were available during my care of the patient were reviewed by me and considered in my medical decision making (see chart for details).   Right knee pain.  Xray of right knee shows no acute fracture.  Treating with knee sleeve, rest, elevation, ice packs, Tylenol or ibuprofen.  Education provided on knee pain.  Instructed patient to follow-up with an orthopedist.  She currently sees [facility] orthopedics in [place] for an issue  with  her right ankle.  She states she will follow-up with them.  She agrees to plan of care.  Final Clinical Impressions(s) / UC Diagnoses   Final diagnoses:  Acute pain of right knee     Discharge Instructions      Wear the knee sleeve as directed.  As directed, take Tylenol or ibuprofen, rest and elevate your knee, apply ice packs.    Follow-up with your orthopedist.     ED Prescriptions   None    PDMP not reviewed this encounter.     [1]  Social History Tobacco Use   Smoking status: Never   Smokeless tobacco: Never  Vaping Use   Vaping status: Never Used  Substance Use Topics   Alcohol use: Yes    Comment: occasional wine   Drug use: No     Corlis Burnard DEL, NP 02/03/24 1138  "

## 2024-02-03 NOTE — ED Triage Notes (Signed)
 Pt states she feel on ice in a parking lot and hit her right knee on the ground. Now having pain with walking and bending her knee. Taking tylenol.

## 2024-02-03 NOTE — Discharge Instructions (Addendum)
 Wear the knee sleeve as directed.  As directed, take Tylenol or ibuprofen, rest and elevate your knee, apply ice packs.    Follow-up with your orthopedist.
